# Patient Record
Sex: Female | Born: 2000 | Race: White | Hispanic: No | Marital: Single | Smoking: Never smoker
Health system: Southern US, Community
[De-identification: ages and names within clinical notes are randomized; demographics above are authoritative.]

## PROBLEM LIST (undated history)

## (undated) DIAGNOSIS — I491 Atrial premature depolarization: Secondary | ICD-10-CM

## (undated) DIAGNOSIS — F32A Depression, unspecified: Secondary | ICD-10-CM

## (undated) DIAGNOSIS — Z789 Other specified health status: Secondary | ICD-10-CM

## (undated) DIAGNOSIS — J039 Acute tonsillitis, unspecified: Secondary | ICD-10-CM

## (undated) DIAGNOSIS — D649 Anemia, unspecified: Secondary | ICD-10-CM

## (undated) HISTORY — PX: NO PAST SURGERIES: SHX2092

---
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
## 2020-02-13 ENCOUNTER — Other Ambulatory Visit: Payer: Self-pay

## 2020-02-13 ENCOUNTER — Ambulatory Visit (HOSPITAL_COMMUNITY)
Admission: EM | Admit: 2020-02-13 | Discharge: 2020-02-13 | Disposition: A | Discharge status: Home / Self Care | Payer: Medicaid Other | Attending: Family Medicine | Admitting: Family Medicine

## 2020-02-13 ENCOUNTER — Encounter (HOSPITAL_COMMUNITY): Payer: Self-pay

## 2020-02-13 DIAGNOSIS — J069 Acute upper respiratory infection, unspecified: Secondary | ICD-10-CM

## 2020-02-13 DIAGNOSIS — J029 Acute pharyngitis, unspecified: Secondary | ICD-10-CM | POA: Diagnosis present

## 2020-02-13 DIAGNOSIS — Z20822 Contact with and (suspected) exposure to covid-19: Secondary | ICD-10-CM | POA: Diagnosis not present

## 2020-02-13 LAB — POCT RAPID STREP A, ED / UC: Streptococcus, Group A Screen (Direct): NEGATIVE

## 2020-02-13 LAB — POC URINE PREG, ED: Preg Test, Ur: NEGATIVE

## 2020-02-13 LAB — SARS CORONAVIRUS 2 (TAT 6-24 HRS): SARS Coronavirus 2: NEGATIVE

## 2020-02-13 MED ORDER — DEXAMETHASONE SODIUM PHOSPHATE 10 MG/ML IJ SOLN
10.0000 mg | Freq: Once | INTRAMUSCULAR | Status: AC
  Administered 2020-02-13: 10 mg via INTRAMUSCULAR

## 2020-02-13 MED ORDER — LIDOCAINE VISCOUS HCL 2 % MT SOLN: 20.0000 mL | OROMUCOSAL | 0 refills | Status: DC | PRN

## 2020-02-13 MED ORDER — DEXAMETHASONE SODIUM PHOSPHATE 10 MG/ML IJ SOLN
INTRAMUSCULAR | Status: AC
  Filled 2020-02-13: qty 1

## 2020-02-13 NOTE — ED Triage Notes (Signed)
Pt is here with chest congestion and sore throat that started 3 days ago, pt has taken an old Amoxillin prescription(05/2018) to relieve discomfort.

## 2020-02-13 NOTE — ED Provider Notes (Signed)
[facility]    CSN: [id number] Arrival date & time: 02/13/20  2202      History   Chief Complaint Chief Complaint  Patient presents with  . Sore Throat  . CHEST CONGESTION    HPI Debbie Miller is a 20 y.o. female.   Here today with 3 day history of congestion, productive cough, sore throat with swollen tonsils, N/V. Denies CP, SOB, HAs, diarrhea, rashes. Had some old amoxil that she's been taking without much relief. Several sick contacts recently. States she has a frequent hx of tonsillitis and scheduled soon to have tonsils removed.      Past Medical History:  Diagnosis Date  . Anemia   . Medical history non-contributory   . PAC (premature atrial contraction)     There are no problems to display for this patient.   Past Surgical History:  Procedure Laterality Date  . NO PAST SURGERIES      OB History    Gravida  0   Para  0   Term  0   Preterm  0   AB  0   Living  0     SAB  0   IAB  0   Ectopic  0   Multiple  0   Live Births  0            Home Medications    Prior to Admission medications   Medication Sig Start Date End Date Taking? Authorizing Provider  lidocaine (XYLOCAINE) 2 % solution Use as directed 20 mLs in the mouth or throat as needed for mouth pain. 02/13/20  Yes Volney American, PA-C  ibuprofen (ADVIL) 600 MG tablet Take 1 tablet (600 mg total) by mouth every 6 (six) hours as needed. 04/19/19   Fawze, Mina A, PA-C  megestrol (MEGACE) 40 MG tablet Take 1 tablet (40 mg total) by mouth 2 (two) times daily. 12/04/19   Nugent, Gerrie Nordmann, NP  ondansetron (ZOFRAN ODT) 4 MG disintegrating tablet Take 1 tablet (4 mg total) by mouth every 8 (eight) hours as needed for nausea or vomiting. 04/19/19   Nils Flack, Mina A, PA-C  phenol (CHLORASEPTIC) 1.4 % LIQD Use as directed 1 spray in the mouth or throat as needed for throat irritation / pain. 04/19/19   Fawze, Mina A, PA-C  diphenhydrAMINE (BENADRYL) 25 MG tablet Take 50 mg by  mouth every 6 (six) hours as needed (for reactions to environmental allergies).   01/21/19  [provider]    Family History Family History  Problem Relation Age of Onset  . Healthy Mother   . Hypertension Maternal Grandmother   . Hypertension Maternal Grandfather   . COPD Maternal Grandfather   . Heart Problems Father     Social History Social History   Tobacco Use  . Smoking status: Former Smoker    Types: Cigarettes  . Smokeless tobacco: Never Used  Vaping Use  . Vaping Use: Some days  Substance Use Topics  . Alcohol use: Never  . Drug use: No     Allergies   Patient has no known allergies.   Review of Systems Review of Systems PER HPI   Physical Exam Triage Vital Signs ED Triage Vitals  Enc Vitals Group     BP 02/13/20 0901 108/72     Pulse Rate 02/13/20 0901 (!) 102     Resp 02/13/20 0901 17     Temp 02/13/20 0901 98.7 F (37.1 C)     Temp Source  02/13/20 0901 Oral     SpO2 02/13/20 0901 100 %     Weight --      Height --      Head Circumference --      Peak Flow --      Pain Score 02/13/20 0858 0     Pain Loc --      Pain Edu? --      Excl. in GC? --    No data found.  Updated Vital Signs BP 108/72 (BP Location: Right Arm)   Pulse (!) 102   Temp 98.7 F (37.1 C) (Oral)   Resp 17   LMP 12/22/2019   SpO2 100%   Visual Acuity Right Eye Distance:   Left Eye Distance:   Bilateral Distance:    Right Eye Near:   Left Eye Near:    Bilateral Near:     Physical Exam Vitals and nursing note reviewed.  Constitutional:      Appearance: Normal appearance. She is not ill-appearing.  HENT:     Head: Atraumatic.     Right Ear: Tympanic membrane normal.     Left Ear: Tympanic membrane normal.     Nose: Nose normal.     Mouth/Throat:     Mouth: Mucous membranes are moist.     Pharynx: Oropharyngeal exudate and posterior oropharyngeal erythema (b/l tonsillar erythema, edema and exudates) present.     Comments: Uvula midline, airway  patent Eyes:     Extraocular Movements: Extraocular movements intact.     Conjunctiva/sclera: Conjunctivae normal.  Cardiovascular:     Rate and Rhythm: Normal rate and regular rhythm.     Heart sounds: Normal heart sounds.  Pulmonary:     Effort: Pulmonary effort is normal. No respiratory distress.     Breath sounds: Normal breath sounds. No wheezing or rales.  Abdominal:     General: Bowel sounds are normal. There is no distension.     Palpations: Abdomen is soft.     Tenderness: There is no abdominal tenderness. There is no guarding.  Musculoskeletal:        General: Normal range of motion.     Cervical back: Normal range of motion and neck supple.  Skin:    General: Skin is warm and dry.  Neurological:     Mental Status: She is alert and oriented to person, place, and time.  Psychiatric:        Mood and Affect: Mood normal.        Thought Content: Thought content normal.        Judgment: Judgment normal.      UC Treatments / Results  Labs (all labs ordered are listed, but only abnormal results are displayed) Labs Reviewed  CULTURE, GROUP A STREP ([facility])  SARS CORONAVIRUS 2 (TAT 6-24 HRS)  POCT RAPID STREP A, ED / UC  POC URINE PREG, ED    EKG   Radiology No results found.  Procedures Procedures (including critical care time)  Medications Ordered in UC Medications  dexamethasone (DECADRON) injection 10 mg (10 mg Intramuscular Given 02/13/20 1040)    Initial Impression / Assessment and Plan / UC Course  I have reviewed the triage vital signs and the nursing notes.  Pertinent labs & imaging results that were available during my care of the patient were reviewed by me and considered in my medical decision making (see chart for details).     Overall well appearing and in no distress. Rapid strep neg, COVID and throat  culture pending. IM decadron given for tonsillar edema, viscous lidocaine for comfort, OTC pain relievers. Work note and isolation reviewed.  Return for worsening sxs.   Final Clinical Impressions(s) / UC Diagnoses   Final diagnoses:  Viral URI with cough  Pharyngitis, unspecified etiology   Discharge Instructions   None    ED Prescriptions    Medication Sig Dispense Auth. Provider   lidocaine (XYLOCAINE) 2 % solution Use as directed 20 mLs in the mouth or throat as needed for mouth pain. 100 mL Particia Nearing, [place]     PDMP not reviewed this encounter.   Particia Nearing, [place] 02/13/20 [phone]

## 2020-02-15 LAB — CULTURE, GROUP A STREP (THRC)

## 2020-02-27 ENCOUNTER — Other Ambulatory Visit: Payer: Self-pay | Admitting: Otolaryngology

## 2020-03-02 ENCOUNTER — Other Ambulatory Visit: Payer: Self-pay

## 2020-03-02 ENCOUNTER — Encounter (HOSPITAL_BASED_OUTPATIENT_CLINIC_OR_DEPARTMENT_OTHER): Payer: Self-pay | Admitting: Otolaryngology

## 2020-03-03 ENCOUNTER — Other Ambulatory Visit (HOSPITAL_COMMUNITY)
Admission: RE | Admit: 2020-03-03 | Discharge: 2020-03-03 | Disposition: A | Discharge status: Home / Self Care | Payer: Medicaid Other | Source: Ambulatory Visit | Attending: Otolaryngology | Admitting: Otolaryngology

## 2020-03-03 DIAGNOSIS — U071 COVID-19: Secondary | ICD-10-CM | POA: Diagnosis not present

## 2020-03-03 DIAGNOSIS — Z01812 Encounter for preprocedural laboratory examination: Secondary | ICD-10-CM | POA: Diagnosis not present

## 2020-03-03 LAB — SARS CORONAVIRUS 2 (TAT 6-24 HRS): SARS Coronavirus 2: POSITIVE — AB

## 2020-03-04 ENCOUNTER — Telehealth: Payer: Self-pay | Admitting: *Deleted

## 2020-03-04 NOTE — Telephone Encounter (Signed)
Called to discuss with patient about COVID-19 symptoms and the use of one of the available treatments for those with mild to moderate Covid symptoms and at a high risk of hospitalization.  Pt appears to qualify for outpatient treatment due to co-morbid conditions and/or a member of an at-risk group in accordance with the FDA Emergency Use Authorization.   Patient reported this was a screen for upcoming procedure. She is asymptomatic now but had several symptoms last week.  Due to lack of symptoms today she does not qualify for Covid treatment.   Reviewed the following with the patient: Your Covid 19 results are positive. This means you do have the virus and can spread these germs to others at this time.  Discussed while staying home and away from others for 5 days wash your hands frequently and wear a mask around others if you need to come out of your area. Drink plenty of fluids to stay hydrated. Ensure disinfecting of all common household areas often.With any breathing concerns call your doctor or seek treatment at the ED with a mask on. At the end of your 5 days of isolation you must be fever free then wear a mask around others for the next 5 days.     Debbie Miller

## 2020-03-04 NOTE — Progress Notes (Signed)
LVM with Dr. Thurmon Fair surgery scheduler Caryn Bee to notify them of patients + COVID result and that patients surgery for Friday 03/06/20 will need to be cancelled and rescheduled in 10 days pending patient does not have symptoms . Pt is aware of her test results.

## 2020-03-06 ENCOUNTER — Ambulatory Visit (HOSPITAL_BASED_OUTPATIENT_CLINIC_OR_DEPARTMENT_OTHER): Admission: RE | Admit: 2020-03-06 | Payer: Medicaid Other | Source: Home / Self Care | Admitting: Otolaryngology

## 2020-03-06 HISTORY — DX: Acute tonsillitis, unspecified: J03.90

## 2020-03-06 SURGERY — TONSILLECTOMY AND ADENOIDECTOMY
Anesthesia: General | Laterality: Bilateral

## 2020-03-25 ENCOUNTER — Encounter: Payer: Medicaid Other | Admitting: Family Medicine

## 2020-03-31 ENCOUNTER — Encounter (HOSPITAL_BASED_OUTPATIENT_CLINIC_OR_DEPARTMENT_OTHER): Payer: Self-pay | Admitting: Otolaryngology

## 2020-03-31 ENCOUNTER — Other Ambulatory Visit: Payer: Self-pay

## 2020-04-08 ENCOUNTER — Encounter (HOSPITAL_BASED_OUTPATIENT_CLINIC_OR_DEPARTMENT_OTHER)
Admission: RE | Disposition: A | Discharge status: Home / Self Care | Payer: Self-pay | Source: Home / Self Care | Attending: Otolaryngology

## 2020-04-08 ENCOUNTER — Ambulatory Visit (HOSPITAL_BASED_OUTPATIENT_CLINIC_OR_DEPARTMENT_OTHER)
Admission: RE | Admit: 2020-04-08 | Discharge: 2020-04-08 | Disposition: A | Discharge status: Home / Self Care | Payer: Medicaid Other | Attending: Otolaryngology | Admitting: Otolaryngology

## 2020-04-08 ENCOUNTER — Encounter (HOSPITAL_BASED_OUTPATIENT_CLINIC_OR_DEPARTMENT_OTHER): Payer: Self-pay | Admitting: Otolaryngology

## 2020-04-08 ENCOUNTER — Ambulatory Visit (HOSPITAL_BASED_OUTPATIENT_CLINIC_OR_DEPARTMENT_OTHER): Payer: Medicaid Other | Admitting: Certified Registered"

## 2020-04-08 ENCOUNTER — Other Ambulatory Visit: Payer: Self-pay

## 2020-04-08 DIAGNOSIS — J0301 Acute recurrent streptococcal tonsillitis: Secondary | ICD-10-CM | POA: Insufficient documentation

## 2020-04-08 DIAGNOSIS — J353 Hypertrophy of tonsils with hypertrophy of adenoids: Secondary | ICD-10-CM | POA: Diagnosis present

## 2020-04-08 DIAGNOSIS — Z87891 Personal history of nicotine dependence: Secondary | ICD-10-CM | POA: Insufficient documentation

## 2020-04-08 DIAGNOSIS — J039 Acute tonsillitis, unspecified: Secondary | ICD-10-CM

## 2020-04-08 DIAGNOSIS — J351 Hypertrophy of tonsils: Secondary | ICD-10-CM | POA: Insufficient documentation

## 2020-04-08 HISTORY — DX: Depression, unspecified: F32.A

## 2020-04-08 HISTORY — PX: TONSILLECTOMY AND ADENOIDECTOMY: SHX28

## 2020-04-08 LAB — POCT PREGNANCY, URINE: Preg Test, Ur: NEGATIVE

## 2020-04-08 SURGERY — TONSILLECTOMY AND ADENOIDECTOMY
Anesthesia: General | Site: Throat | Laterality: Bilateral

## 2020-04-08 MED ORDER — HYDROCODONE-ACETAMINOPHEN 7.5-325 MG/15ML PO SOLN
10.0000 mL | Freq: Four times a day (QID) | ORAL | Status: DC | PRN
  Administered 2020-04-08: 15 mL via ORAL
  Filled 2020-04-08: qty 15

## 2020-04-08 MED ORDER — SUGAMMADEX SODIUM 200 MG/2ML IV SOLN: INTRAVENOUS | Status: DC | PRN

## 2020-04-08 MED ORDER — LIDOCAINE 2% (20 MG/ML) 5 ML SYRINGE
INTRAMUSCULAR | Status: AC
  Filled 2020-04-08: qty 5

## 2020-04-08 MED ORDER — HYDROCODONE-ACETAMINOPHEN 7.5-325 MG/15ML PO SOLN: ORAL | 0 refills | Status: DC

## 2020-04-08 MED ORDER — FENTANYL CITRATE (PF) 100 MCG/2ML IJ SOLN
25.0000 ug | INTRAMUSCULAR | Status: DC | PRN
  Administered 2020-04-08: 50 ug via INTRAVENOUS
  Administered 2020-04-08: 25 ug via INTRAVENOUS

## 2020-04-08 MED ORDER — DEXAMETHASONE SODIUM PHOSPHATE 10 MG/ML IJ SOLN
10.0000 mg | Freq: Once | INTRAMUSCULAR | Status: AC
  Administered 2020-04-08: 10 mg via INTRAVENOUS
  Filled 2020-04-08: qty 1

## 2020-04-08 MED ORDER — FENTANYL CITRATE (PF) 100 MCG/2ML IJ SOLN
INTRAMUSCULAR | Status: DC | PRN
  Administered 2020-04-08 (×2): 50 ug via INTRAVENOUS
  Administered 2020-04-08: 100 ug via INTRAVENOUS

## 2020-04-08 MED ORDER — SCOPOLAMINE 1 MG/3DAYS TD PT72
1.0000 | MEDICATED_PATCH | TRANSDERMAL | Status: DC
  Administered 2020-04-08: 1.5 mg via TRANSDERMAL

## 2020-04-08 MED ORDER — ONDANSETRON HCL 4 MG/2ML IJ SOLN
INTRAMUSCULAR | Status: DC | PRN
  Administered 2020-04-08: 4 mg via INTRAVENOUS

## 2020-04-08 MED ORDER — SUGAMMADEX SODIUM 200 MG/2ML IV SOLN
INTRAVENOUS | Status: DC | PRN
  Administered 2020-04-08: 200 mg via INTRAVENOUS

## 2020-04-08 MED ORDER — CEFAZOLIN SODIUM-DEXTROSE 2-4 GM/100ML-% IV SOLN
2.0000 g | INTRAVENOUS | Status: AC
  Administered 2020-04-08: 2 g via INTRAVENOUS

## 2020-04-08 MED ORDER — ACETAMINOPHEN 500 MG PO TABS
ORAL_TABLET | ORAL | Status: AC
  Filled 2020-04-08: qty 2

## 2020-04-08 MED ORDER — ONDANSETRON HCL 4 MG/2ML IJ SOLN
INTRAMUSCULAR | Status: AC
  Filled 2020-04-08: qty 2

## 2020-04-08 MED ORDER — ACETAMINOPHEN 500 MG PO TABS
1000.0000 mg | ORAL_TABLET | Freq: Once | ORAL | Status: AC
  Administered 2020-04-08: 1000 mg via ORAL

## 2020-04-08 MED ORDER — PROPOFOL 10 MG/ML IV BOLUS
INTRAVENOUS | Status: DC | PRN
  Administered 2020-04-08: 100 ug/kg/min via INTRAVENOUS
  Administered 2020-04-08: 50 mg via INTRAVENOUS
  Administered 2020-04-08: 150 mg via INTRAVENOUS

## 2020-04-08 MED ORDER — CEFAZOLIN SODIUM-DEXTROSE 2-4 GM/100ML-% IV SOLN
INTRAVENOUS | Status: AC
  Filled 2020-04-08: qty 100

## 2020-04-08 MED ORDER — PHENOL 1.4 % MT LIQD: 1.0000 | OROMUCOSAL | Status: DC | PRN

## 2020-04-08 MED ORDER — ROCURONIUM BROMIDE 10 MG/ML (PF) SYRINGE
PREFILLED_SYRINGE | INTRAVENOUS | Status: AC
  Filled 2020-04-08: qty 10

## 2020-04-08 MED ORDER — DEXMEDETOMIDINE (PRECEDEX) IN NS 20 MCG/5ML (4 MCG/ML) IV SYRINGE
PREFILLED_SYRINGE | INTRAVENOUS | Status: DC | PRN
  Administered 2020-04-08: 8 ug via INTRAVENOUS
  Administered 2020-04-08: 4 ug via INTRAVENOUS

## 2020-04-08 MED ORDER — FENTANYL CITRATE (PF) 100 MCG/2ML IJ SOLN
INTRAMUSCULAR | Status: AC
  Filled 2020-04-08: qty 2

## 2020-04-08 MED ORDER — AMOXICILLIN-POT CLAVULANATE 250-62.5 MG/5ML PO SUSR: 10.0000 mL | Freq: Two times a day (BID) | ORAL | 0 refills | Status: AC

## 2020-04-08 MED ORDER — DEXAMETHASONE SODIUM PHOSPHATE 4 MG/ML IJ SOLN
INTRAMUSCULAR | Status: DC | PRN
  Administered 2020-04-08: 10 mg via INTRAVENOUS

## 2020-04-08 MED ORDER — PROPOFOL 500 MG/50ML IV EMUL
INTRAVENOUS | Status: AC
  Filled 2020-04-08: qty 50

## 2020-04-08 MED ORDER — MORPHINE SULFATE (PF) 4 MG/ML IV SOLN: 1.0000 mg | INTRAVENOUS | Status: DC | PRN

## 2020-04-08 MED ORDER — AMISULPRIDE (ANTIEMETIC) 5 MG/2ML IV SOLN: 10.0000 mg | Freq: Once | INTRAVENOUS | Status: DC | PRN

## 2020-04-08 MED ORDER — SCOPOLAMINE 1 MG/3DAYS TD PT72
MEDICATED_PATCH | TRANSDERMAL | Status: AC
  Filled 2020-04-08: qty 1

## 2020-04-08 MED ORDER — ROCURONIUM BROMIDE 100 MG/10ML IV SOLN
INTRAVENOUS | Status: DC | PRN
  Administered 2020-04-08: 50 mg via INTRAVENOUS

## 2020-04-08 MED ORDER — MIDAZOLAM HCL 5 MG/5ML IJ SOLN
INTRAMUSCULAR | Status: DC | PRN
  Administered 2020-04-08: 2 mg via INTRAVENOUS

## 2020-04-08 MED ORDER — MIDAZOLAM HCL 2 MG/2ML IJ SOLN
INTRAMUSCULAR | Status: AC
  Filled 2020-04-08: qty 2

## 2020-04-08 MED ORDER — ONDANSETRON HCL 4 MG/2ML IJ SOLN: 4.0000 mg | INTRAMUSCULAR | Status: DC | PRN

## 2020-04-08 MED ORDER — LACTATED RINGERS IV SOLN: INTRAVENOUS | Status: DC

## 2020-04-08 MED ORDER — OXYCODONE HCL 5 MG PO TABS: 5.0000 mg | ORAL_TABLET | Freq: Once | ORAL | Status: DC | PRN

## 2020-04-08 MED ORDER — DEXAMETHASONE SODIUM PHOSPHATE 10 MG/ML IJ SOLN
INTRAMUSCULAR | Status: AC
  Filled 2020-04-08: qty 1

## 2020-04-08 MED ORDER — LIDOCAINE HCL (CARDIAC) PF 100 MG/5ML IV SOSY
PREFILLED_SYRINGE | INTRAVENOUS | Status: DC | PRN
  Administered 2020-04-08: 60 mg via INTRAVENOUS

## 2020-04-08 MED ORDER — ONDANSETRON HCL 4 MG PO TABS: 4.0000 mg | ORAL_TABLET | ORAL | Status: DC | PRN

## 2020-04-08 MED ORDER — PROPOFOL 10 MG/ML IV BOLUS
INTRAVENOUS | Status: AC
  Filled 2020-04-08: qty 40

## 2020-04-08 MED ORDER — IBUPROFEN 100 MG/5ML PO SUSP: 600.0000 mg | Freq: Four times a day (QID) | ORAL | Status: DC | PRN

## 2020-04-08 MED ORDER — DEXTROSE IN LACTATED RINGERS 5 % IV SOLN: INTRAVENOUS | Status: DC

## 2020-04-08 MED ORDER — OXYCODONE HCL 5 MG/5ML PO SOLN: 5.0000 mg | Freq: Once | ORAL | Status: DC | PRN

## 2020-04-08 SURGICAL SUPPLY — 29 items
CANISTER SUCT 1200ML W/VALVE (MISCELLANEOUS) ×2 IMPLANT
CATH ROBINSON RED A/P 10FR (CATHETERS) ×2 IMPLANT
COAGULATOR SUCT SWTCH 10FR 6 (ELECTROSURGICAL) ×2 IMPLANT
COVER BACK TABLE 60X90IN (DRAPES) ×2 IMPLANT
COVER MAYO STAND STRL (DRAPES) ×2 IMPLANT
COVER WAND RF STERILE (DRAPES) IMPLANT
ELECT COATED BLADE 2.86 ST (ELECTRODE) ×2 IMPLANT
ELECT REM PT RETURN 9FT ADLT (ELECTROSURGICAL) ×2
ELECT REM PT RETURN 9FT PED (ELECTROSURGICAL)
ELECTRODE REM PT RETRN 9FT PED (ELECTROSURGICAL) IMPLANT
ELECTRODE REM PT RTRN 9FT ADLT (ELECTROSURGICAL) ×1 IMPLANT
GAUZE SPONGE 4X4 12PLY STRL LF (GAUZE/BANDAGES/DRESSINGS) ×2 IMPLANT
GLOVE SURG ENC TEXT LTX SZ7 (GLOVE) ×2 IMPLANT
GOWN STRL REUS W/ TWL LRG LVL3 (GOWN DISPOSABLE) ×2 IMPLANT
GOWN STRL REUS W/TWL LRG LVL3 (GOWN DISPOSABLE) ×4
MARKER SKIN DUAL TIP RULER LAB (MISCELLANEOUS) IMPLANT
NS IRRIG 1000ML POUR BTL (IV SOLUTION) ×2 IMPLANT
PENCIL SMOKE EVACUATOR (MISCELLANEOUS) ×2 IMPLANT
PIN SAFETY STERILE (MISCELLANEOUS) IMPLANT
SHEET MEDIUM DRAPE 40X70 STRL (DRAPES) ×2 IMPLANT
SOLUTION BUTLER CLEAR DIP (MISCELLANEOUS) IMPLANT
SPONGE TONSIL TAPE 1 RFD (DISPOSABLE) IMPLANT
SPONGE TONSIL TAPE 1.25 RFD (DISPOSABLE) ×2 IMPLANT
SYR BULB EAR ULCER 3OZ GRN STR (SYRINGE) ×2 IMPLANT
TOWEL GREEN STERILE FF (TOWEL DISPOSABLE) ×2 IMPLANT
TUBE CONNECTING 20X1/4 (TUBING) ×2 IMPLANT
TUBE SALEM SUMP 12R W/ARV (TUBING) IMPLANT
TUBE SALEM SUMP 16 FR W/ARV (TUBING) ×2 IMPLANT
YANKAUER SUCT BULB TIP NO VENT (SUCTIONS) ×2 IMPLANT

## 2020-04-08 NOTE — Anesthesia Procedure Notes (Signed)
Procedure Name: Intubation Performed by: Ezequiel Kayser, CRNA Pre-anesthesia Checklist: Patient identified, Emergency Drugs available, Suction available and Patient being monitored Patient Re-evaluated:Patient Re-evaluated prior to induction Oxygen Delivery Method: Circle System Utilized Preoxygenation: Pre-oxygenation with 100% oxygen Induction Type: IV induction Ventilation: Mask ventilation without difficulty Laryngoscope Size: Mac and 3 Grade View: Grade I Tube type: Oral Rae Tube size: 7.0 mm Number of attempts: 1 Airway Equipment and Method: Stylet and Oral airway Placement Confirmation: ETT inserted through vocal cords under direct vision,  positive ETCO2 and breath sounds checked- equal and bilateral Tube secured with: Tape Dental Injury: Teeth and Oropharynx as per pre-operative assessment  Comments: Eyes taped prior to mask ventilation

## 2020-04-08 NOTE — Discharge Instructions (Signed)

## 2020-04-08 NOTE — Anesthesia Postprocedure Evaluation (Signed)
Anesthesia Post Note  Patient: Debbie Miller  Procedure(s) Performed: TONSILLECTOMY AND ADENOIDECTOMY (Bilateral Throat)     Patient location during evaluation: PACU Anesthesia Type: General Level of consciousness: awake and alert Pain management: pain level controlled Vital Signs Assessment: post-procedure vital signs reviewed and stable Respiratory status: spontaneous breathing, nonlabored ventilation, respiratory function stable and patient connected to nasal cannula oxygen Cardiovascular status: blood pressure returned to baseline and stable Postop Assessment: no apparent nausea or vomiting Anesthetic complications: no   No complications documented.  Last Vitals:  Vitals:   04/08/20 1015 04/08/20 1033  BP: 106/63 106/66  Pulse: 62 (!) 54  Resp: 14 16  Temp:  36.4 C  SpO2: 100% 100%    Last Pain:  Vitals:   04/08/20 1033  TempSrc:   PainSc: 6                  Kennieth Rad

## 2020-04-08 NOTE — Op Note (Signed)
Operative Note: Tonsillectomy and Adenoidectomy  Patient: Debbie Miller  Medical record number: [id number]  Date:04/08/2020  Pre-operative Indications: Recurrent streptococcal tonsillitis  Postoperative Indications: Same  Surgical Procedure: Tonsillectomy and Adenoidectomy  Anesthesia: GET  Surgeon: Barbee Cough, M.D.  Complications: None  EBL: Minimal   Brief History: The patient is a 20 y.o. female with a history of recurrent acute tonsillitis and adenotonsillar hypertrophy. The patient has been on multiple courses of antibiotics for recurrent infection and has a history of nighttime snoring and intermittent airway obstruction. Patient's history and findings I recommended tonsillectomy and adenoidectomy under general anesthesia, risks and benefits were discussed in detail with the patient and family. They understand and agree with our plan for surgery which is scheduled on elective basis at [facility].  Surgical Procedure: The patient is brought to the operating room on 04/08/2020 and placed in supine position on the operating table. General endotracheal anesthesia was established without difficulty. When the patient was adequately anesthetized, surgical timeout was performed and correct identification of the patient and the surgical procedure. The patient was positioned and prepped and draped in sterile fashion.  With the patient prepared for surgery a Lisabeth Register mouth gag was inserted without difficulty, there were no loose or broken teeth and the hard soft palate were intact. Procedure was begun with adenoidectomy, using Bovie suction cautery at 45 W the adenoid tissue was completely ablated in the nasopharynx, no bleeding or evidence of residual adenoidal tissue. Tonsillectomy was then performed, using Bovie cautery and dissecting in a subcapsular fashion the entire left tonsil was removed from superior pole to tongue base. Right tonsil removed in a similar fashion. The tonsillar fossa  were gently abraded with dry tonsil sponge and several small areas of point hemorrhage were cauterized with suction cautery. The Crowe-Davis mouth gag was released and reapplied there is no active bleeding. Oral cavity and nasopharynx were irrigated with saline. An orogastric tube was passed and stomach contents were aspirated. Mouthgag was removed, again no loose or broken teeth and no bleeding.   Patient was awakened from anesthetic and extubated, then transferred from the operating room to the recovery room in stable condition. There were no complications and blood loss was minimal.   Barbee Cough, M.D. [facility] 04/08/2020

## 2020-04-08 NOTE — Transfer of Care (Signed)
Immediate Anesthesia Transfer of Care Note  Patient: Debbie Miller  Procedure(s) Performed: TONSILLECTOMY AND ADENOIDECTOMY (Bilateral Throat)  Patient Location: PACU  Anesthesia Type:General  Level of Consciousness: awake, alert , oriented and patient cooperative  Airway & Oxygen Therapy: Patient Spontanous Breathing and Patient connected to face mask oxygen  Post-op Assessment: Report given to RN and Post -op Vital signs reviewed and stable  Post vital signs: Reviewed and stable  Last Vitals:  Vitals Value Taken Time  BP    Temp    Pulse 55 04/08/20 0941  Resp    SpO2 100 % 04/08/20 0941  Vitals shown include unvalidated device data.  Last Pain:  Vitals:   04/08/20 0654  TempSrc: Oral  PainSc: 0-No pain      Patients Stated Pain Goal: 2 (04/08/20 0654)  Complications: No complications documented.

## 2020-04-08 NOTE — H&P (Signed)
Debbie Miller is an 20 y.o. female.   Chief Complaint: Recurrent tonsillitis HPI: Hx of recurrent tonsillitis  Past Medical History:  Diagnosis Date  . Anemia   . Depression   . PAC (premature atrial contraction)   . Tonsillitis     Past Surgical History:  Procedure Laterality Date  . NO PAST SURGERIES      Family History  Problem Relation Age of Onset  . Healthy Mother   . Hypertension Maternal Grandmother   . Hypertension Maternal Grandfather   . COPD Maternal Grandfather   . Heart Problems Father    Social History:  reports that she has quit smoking. Her smoking use included cigarettes. She has never used smokeless tobacco. She reports that she does not drink alcohol and does not use drugs.  Allergies: No Known Allergies  No medications prior to admission.    Results for orders placed or performed during the hospital encounter of 04/08/20 (from the past 48 hour(s))  Pregnancy, urine POC     Status: None   Collection Time: 04/08/20  6:41 AM  Result Value Ref Range   Preg Test, Ur NEGATIVE NEGATIVE    Comment:        THE SENSITIVITY OF THIS METHODOLOGY IS >24 mIU/mL    No results found.  Review of Systems  HENT: Positive for sore throat.   Respiratory: Negative.   Cardiovascular: Negative.     Blood pressure (!) 106/53, pulse 63, temperature 98.2 F (36.8 C), temperature source Oral, resp. rate 18, height 5\' 9"  (1.753 m), weight 63 kg, last menstrual period 03/29/2020, SpO2 100 %. Physical Exam Constitutional:      Appearance: Normal appearance.  HENT:     Mouth/Throat:     Comments: Tonsillar hypertrophy Cardiovascular:     Rate and Rhythm: Normal rate.  Pulmonary:     Effort: Pulmonary effort is normal.  Musculoskeletal:     Cervical back: Normal range of motion.  Neurological:     Mental Status: She is alert.      Assessment/Plan Adm for OP T&A  03/31/2020, MD 04/08/2020, 8:32 AM

## 2020-04-08 NOTE — Anesthesia Preprocedure Evaluation (Signed)
Anesthesia Evaluation  Patient identified by MRN, date of birth, ID band Patient awake    Reviewed: Allergy & Precautions, NPO status , Patient's Chart, lab work & pertinent test results  Airway Mallampati: II  TM Distance: >3 FB Neck ROM: Full    Dental  (+) Dental Advisory Given   Pulmonary former smoker,    breath sounds clear to auscultation       Cardiovascular negative cardio ROS   Rhythm:Regular Rate:Normal     Neuro/Psych negative neurological ROS     GI/Hepatic negative GI ROS, Neg liver ROS,   Endo/Other    Renal/GU negative Renal ROS     Musculoskeletal   Abdominal   Peds  Hematology negative hematology ROS (+)   Anesthesia Other Findings   Reproductive/Obstetrics                             Anesthesia Physical Anesthesia Plan  ASA: I  Anesthesia Plan: General   Post-op Pain Management:    Induction: Intravenous  PONV Risk Score and Plan: 4 or greater and Ondansetron, Dexamethasone, Midazolam and Scopolamine patch - Pre-op  Airway Management Planned: Oral ETT  Additional Equipment: None  Intra-op Plan:   Post-operative Plan: Extubation in OR  Informed Consent: I have reviewed the patients History and Physical, chart, labs and discussed the procedure including the risks, benefits and alternatives for the proposed anesthesia with the patient or authorized representative who has indicated his/her understanding and acceptance.     Dental advisory given  Plan Discussed with: CRNA  Anesthesia Plan Comments:         Anesthesia Quick Evaluation

## 2020-04-09 ENCOUNTER — Encounter (HOSPITAL_BASED_OUTPATIENT_CLINIC_OR_DEPARTMENT_OTHER): Payer: Self-pay | Admitting: Otolaryngology

## 2020-04-09 LAB — SURGICAL PATHOLOGY

## 2020-07-04 ENCOUNTER — Ambulatory Visit
Admission: EM | Admit: 2020-07-04 | Discharge: 2020-07-04 | Disposition: A | Discharge status: Home / Self Care | Payer: Medicaid Other | Attending: Emergency Medicine | Admitting: Emergency Medicine

## 2020-07-04 ENCOUNTER — Encounter: Payer: Self-pay | Admitting: Emergency Medicine

## 2020-07-04 ENCOUNTER — Other Ambulatory Visit: Payer: Self-pay

## 2020-07-04 DIAGNOSIS — R110 Nausea: Secondary | ICD-10-CM | POA: Diagnosis not present

## 2020-07-04 DIAGNOSIS — J029 Acute pharyngitis, unspecified: Secondary | ICD-10-CM | POA: Diagnosis not present

## 2020-07-04 LAB — POCT RAPID STREP A (OFFICE): Rapid Strep A Screen: NEGATIVE

## 2020-07-04 MED ORDER — CETIRIZINE HCL 10 MG PO CAPS: 10.0000 mg | ORAL_CAPSULE | Freq: Every day | ORAL | 0 refills | Status: DC

## 2020-07-04 MED ORDER — ONDANSETRON 4 MG PO TBDP: 4.0000 mg | ORAL_TABLET | Freq: Three times a day (TID) | ORAL | 0 refills | Status: DC | PRN

## 2020-07-04 MED ORDER — IBUPROFEN 800 MG PO TABS: 800.0000 mg | ORAL_TABLET | Freq: Three times a day (TID) | ORAL | 0 refills | Status: DC

## 2020-07-04 NOTE — Discharge Instructions (Addendum)
Sore Throat  Your rapid strep tested Negative today. Symptoms likely viral and should improve over the next 3-4 days  Please continue Tylenol or Ibuprofen for fever and pain. May try salt water gargles, cepacol lozenges, throat spray, or OTC cold relief medicine for throat discomfort. If you also have congestion take a daily anti-histamine like Zyrtec, Claritin, and a oral decongestant to help with post nasal drip that may be irritating your throat.   Stay hydrated and drink plenty of fluids to keep your throat coated relieve irritation.

## 2020-07-04 NOTE — ED Triage Notes (Signed)
Pt sts sore throat and nausea x 3 days; pt sts HA; pt sts had tonsils removed 1 month ago

## 2020-07-04 NOTE — ED Provider Notes (Signed)
[facility]    CSN: [id number] Arrival date & time: 07/04/20  1244      History   Chief Complaint Chief Complaint  Patient presents with  . Sore Throat  . Nausea    HPI Debbie Miller is a 20 y.o. female presenting today for evaluation of sore throat.  Reports 3 days of sore throat with associated nausea.  Intermittent dizziness/lightheadedness and headache.  Reports had tonsillectomy approximately 1 month ago.  Has had associated congestion and drainage.  Denies fevers.  Denies cough.  HPI  Past Medical History:  Diagnosis Date  . Anemia   . Depression   . PAC (premature atrial contraction)   . Tonsillitis     Patient Active Problem List   Diagnosis Date Noted  . Acute tonsillitis 04/08/2020  . Adenotonsillar hypertrophy 04/08/2020    Past Surgical History:  Procedure Laterality Date  . NO PAST SURGERIES    . TONSILLECTOMY AND ADENOIDECTOMY Bilateral 04/08/2020   Procedure: TONSILLECTOMY AND ADENOIDECTOMY;  Surgeon: Osborn Coho, MD;  Location: [facility];  Service: ENT;  Laterality: Bilateral;    OB History    Gravida  0   Para  0   Term  0   Preterm  0   AB  0   Living  0     SAB  0   IAB  0   Ectopic  0   Multiple  0   Live Births  0            Home Medications    Prior to Admission medications   Medication Sig Start Date End Date Taking? Authorizing Provider  Cetirizine HCl 10 MG CAPS Take 1 capsule (10 mg total) by mouth daily for 10 days. 07/04/20 07/14/20 Yes Larkyn Greenberger C, PA-C  ibuprofen (ADVIL) 800 MG tablet Take 1 tablet (800 mg total) by mouth 3 (three) times daily. 07/04/20  Yes Oria Klimas C, PA-C  ondansetron (ZOFRAN ODT) 4 MG disintegrating tablet Take 1 tablet (4 mg total) by mouth every 8 (eight) hours as needed for nausea or vomiting. 07/04/20  Yes Lamyah Creed C, PA-C  diphenhydrAMINE (BENADRYL) 25 MG tablet Take 50 mg by mouth every 6 (six) hours as needed (for reactions to  environmental allergies).   01/21/19  [provider]    Family History Family History  Problem Relation Age of Onset  . Healthy Mother   . Hypertension Maternal Grandmother   . Hypertension Maternal Grandfather   . COPD Maternal Grandfather   . Heart Problems Father     Social History Social History   Tobacco Use  . Smoking status: Former Smoker    Types: Cigarettes  . Smokeless tobacco: Never Used  Vaping Use  . Vaping Use: Some days  Substance Use Topics  . Alcohol use: Never  . Drug use: No     Allergies   Patient has no known allergies.   Review of Systems Review of Systems  Constitutional: Negative for activity change, appetite change, chills, fatigue and fever.  HENT: Positive for sore throat. Negative for congestion, ear pain, rhinorrhea, sinus pressure and trouble swallowing.   Eyes: Negative for discharge and redness.  Respiratory: Negative for cough, chest tightness and shortness of breath.   Cardiovascular: Negative for chest pain.  Gastrointestinal: Positive for nausea. Negative for abdominal pain, diarrhea and vomiting.  Musculoskeletal: Negative for myalgias.  Skin: Negative for rash.  Neurological: Negative for dizziness, light-headedness and headaches.     Physical  Exam Triage Vital Signs ED Triage Vitals  Enc Vitals Group     BP      Pulse      Resp      Temp      Temp src      SpO2      Weight      Height      Head Circumference      Peak Flow      Pain Score      Pain Loc      Pain Edu?      Excl. in GC?    No data found.  Updated Vital Signs BP (!) 116/59 (BP Location: Left Arm)   Pulse 72   Temp 98.1 F (36.7 C) (Oral)   Resp 18   SpO2 98%   Visual Acuity Right Eye Distance:   Left Eye Distance:   Bilateral Distance:    Right Eye Near:   Left Eye Near:    Bilateral Near:     Physical Exam Vitals and nursing note reviewed.  Constitutional:      Appearance: She is well-developed.     Comments: No  acute distress  HENT:     Head: Normocephalic and atraumatic.     Ears:     Comments: Bilateral ears without tenderness to palpation of external auricle, tragus and mastoid, EAC's without erythema or swelling, TM's with good bony landmarks and cone of light. Non erythematous.     Nose: Nose normal.     Mouth/Throat:     Comments: Oral mucosa pink and moist, no tonsillar enlargement or exudate. Posterior pharynx patent and nonerythematous, no uvula deviation or swelling. Normal phonation. Eyes:     Conjunctiva/sclera: Conjunctivae normal.  Cardiovascular:     Rate and Rhythm: Normal rate.  Pulmonary:     Effort: Pulmonary effort is normal. No respiratory distress.     Comments: Breathing comfortably at rest, CTABL, no wheezing, rales or other adventitious sounds auscultated Abdominal:     General: There is no distension.  Musculoskeletal:        General: Normal range of motion.     Cervical back: Neck supple.  Skin:    General: Skin is warm and dry.  Neurological:     Mental Status: She is alert and oriented to person, place, and time.      UC Treatments / Results  Labs (all labs ordered are listed, but only abnormal results are displayed) Labs Reviewed  CULTURE, GROUP A STREP [facility])  POCT RAPID STREP A (OFFICE)    EKG   Radiology No results found.  Procedures Procedures (including critical care time)  Medications Ordered in UC Medications - No data to display  Initial Impression / Assessment and Plan / UC Course  I have reviewed the triage vital signs and the nursing notes.  Pertinent labs & imaging results that were available during my care of the patient were reviewed by me and considered in my medical decision making (see chart for details).     Sore throat- strep test negative, recommending symptomatic and supportive care as likely viral etiology.  Exam reassuring.  Zofran for nausea.  Discussed strict return precautions. Patient verbalized understanding  and is agreeable with plan.  Final Clinical Impressions(s) / UC Diagnoses   Final diagnoses:  Sore throat  Nausea without vomiting     Discharge Instructions     Sore Throat  Your rapid strep tested Negative today. Symptoms likely viral and should improve over  the next 3-4 days  Please continue Tylenol or Ibuprofen for fever and pain. May try salt water gargles, cepacol lozenges, throat spray, or OTC cold relief medicine for throat discomfort. If you also have congestion take a daily anti-histamine like Zyrtec, Claritin, and a oral decongestant to help with post nasal drip that may be irritating your throat.   Stay hydrated and drink plenty of fluids to keep your throat coated relieve irritation.     ED Prescriptions    Medication Sig Dispense Auth. Provider   ibuprofen (ADVIL) 800 MG tablet Take 1 tablet (800 mg total) by mouth 3 (three) times daily. 21 tablet Kouper Spinella C, PA-C   Cetirizine HCl 10 MG CAPS Take 1 capsule (10 mg total) by mouth daily for 10 days. 15 capsule Davonne Baby C, PA-C   ondansetron (ZOFRAN ODT) 4 MG disintegrating tablet Take 1 tablet (4 mg total) by mouth every 8 (eight) hours as needed for nausea or vomiting. 20 tablet Chandlar Guice, [place] C, PA-C     PDMP not reviewed this encounter.   Lew Dawes, [place] 07/04/20 1432

## 2020-07-07 LAB — CULTURE, GROUP A STREP (THRC)

## 2020-08-07 ENCOUNTER — Emergency Department (HOSPITAL_BASED_OUTPATIENT_CLINIC_OR_DEPARTMENT_OTHER)
Admission: EM | Admit: 2020-08-07 | Discharge: 2020-08-07 | Disposition: A | Discharge status: Home / Self Care | Payer: Medicaid Other | Attending: Emergency Medicine | Admitting: Emergency Medicine

## 2020-08-07 ENCOUNTER — Other Ambulatory Visit: Payer: Self-pay

## 2020-08-07 ENCOUNTER — Ambulatory Visit
Admission: EM | Admit: 2020-08-07 | Discharge: 2020-08-07 | Disposition: A | Discharge status: Other Acute Inpatient Hospital | Payer: Medicaid Other | Attending: Family Medicine | Admitting: Family Medicine

## 2020-08-07 ENCOUNTER — Encounter (HOSPITAL_BASED_OUTPATIENT_CLINIC_OR_DEPARTMENT_OTHER): Payer: Self-pay | Admitting: *Deleted

## 2020-08-07 ENCOUNTER — Emergency Department (HOSPITAL_BASED_OUTPATIENT_CLINIC_OR_DEPARTMENT_OTHER): Payer: Medicaid Other

## 2020-08-07 DIAGNOSIS — B9689 Other specified bacterial agents as the cause of diseases classified elsewhere: Secondary | ICD-10-CM

## 2020-08-07 DIAGNOSIS — N83291 Other ovarian cyst, right side: Secondary | ICD-10-CM | POA: Diagnosis not present

## 2020-08-07 DIAGNOSIS — N760 Acute vaginitis: Secondary | ICD-10-CM | POA: Insufficient documentation

## 2020-08-07 DIAGNOSIS — N83209 Unspecified ovarian cyst, unspecified side: Secondary | ICD-10-CM

## 2020-08-07 DIAGNOSIS — R1110 Vomiting, unspecified: Secondary | ICD-10-CM | POA: Insufficient documentation

## 2020-08-07 DIAGNOSIS — R112 Nausea with vomiting, unspecified: Secondary | ICD-10-CM

## 2020-08-07 DIAGNOSIS — R1031 Right lower quadrant pain: Secondary | ICD-10-CM

## 2020-08-07 LAB — WET PREP, GENITAL
Sperm: NONE SEEN
Trich, Wet Prep: NONE SEEN
Yeast Wet Prep HPF POC: NONE SEEN

## 2020-08-07 LAB — CBC WITH DIFFERENTIAL/PLATELET
Abs Immature Granulocytes: 0.01 10*3/uL (ref 0.00–0.07)
Basophils Absolute: 0 10*3/uL (ref 0.0–0.1)
Basophils Relative: 1 %
Eosinophils Absolute: 0.1 10*3/uL (ref 0.0–0.5)
Eosinophils Relative: 2 %
HCT: 37.8 % (ref 36.0–46.0)
Hemoglobin: 12.6 g/dL (ref 12.0–15.0)
Immature Granulocytes: 0 %
Lymphocytes Relative: 38 %
Lymphs Abs: 2.6 10*3/uL (ref 0.7–4.0)
MCH: 31 pg (ref 26.0–34.0)
MCHC: 33.3 g/dL (ref 30.0–36.0)
MCV: 92.9 fL (ref 80.0–100.0)
Monocytes Absolute: 0.6 10*3/uL (ref 0.1–1.0)
Monocytes Relative: 9 %
Neutro Abs: 3.5 10*3/uL (ref 1.7–7.7)
Neutrophils Relative %: 50 %
Platelets: 221 10*3/uL (ref 150–400)
RBC: 4.07 MIL/uL (ref 3.87–5.11)
RDW: 14.3 % (ref 11.5–15.5)
WBC: 6.9 10*3/uL (ref 4.0–10.5)
nRBC: 0 % (ref 0.0–0.2)

## 2020-08-07 LAB — URINALYSIS, ROUTINE W REFLEX MICROSCOPIC
Bilirubin Urine: NEGATIVE
Glucose, UA: NEGATIVE mg/dL
Hgb urine dipstick: NEGATIVE
Ketones, ur: NEGATIVE mg/dL
Nitrite: NEGATIVE
Protein, ur: NEGATIVE mg/dL
Specific Gravity, Urine: 1.024 (ref 1.005–1.030)
pH: 8.5 — ABNORMAL HIGH (ref 5.0–8.0)

## 2020-08-07 LAB — POCT URINALYSIS DIP (MANUAL ENTRY)
Bilirubin, UA: NEGATIVE
Blood, UA: NEGATIVE
Glucose, UA: NEGATIVE mg/dL
Ketones, POC UA: NEGATIVE mg/dL
Leukocytes, UA: NEGATIVE
Nitrite, UA: NEGATIVE
Protein Ur, POC: NEGATIVE mg/dL
Spec Grav, UA: 1.025 (ref 1.010–1.025)
Urobilinogen, UA: 0.2 E.U./dL
pH, UA: 6.5 (ref 5.0–8.0)

## 2020-08-07 LAB — COMPREHENSIVE METABOLIC PANEL
ALT: 6 U/L (ref 0–44)
AST: 9 U/L — ABNORMAL LOW (ref 15–41)
Albumin: 4.4 g/dL (ref 3.5–5.0)
Alkaline Phosphatase: 35 U/L — ABNORMAL LOW (ref 38–126)
Anion gap: 8 (ref 5–15)
BUN: 10 mg/dL (ref 6–20)
CO2: 27 mmol/L (ref 22–32)
Calcium: 9.8 mg/dL (ref 8.9–10.3)
Chloride: 105 mmol/L (ref 98–111)
Creatinine, Ser: 0.76 mg/dL (ref 0.44–1.00)
GFR, Estimated: >60 mL/min (ref >60)
Glucose, Bld: 92 mg/dL (ref 70–99)
Potassium: 3.8 mmol/L (ref 3.5–5.1)
Sodium: 140 mmol/L (ref 135–145)
Total Bilirubin: 0.7 mg/dL (ref 0.3–1.2)
Total Protein: 7.3 g/dL (ref 6.5–8.1)

## 2020-08-07 LAB — LIPASE, BLOOD: Lipase: 11 U/L (ref 11–51)

## 2020-08-07 LAB — POCT URINE PREGNANCY: Preg Test, Ur: NEGATIVE

## 2020-08-07 MED ORDER — SODIUM CHLORIDE 0.9 % IV BOLUS
1000.0000 mL | Freq: Once | INTRAVENOUS | Status: AC
  Administered 2020-08-07: 13:00:00 1000 mL via INTRAVENOUS

## 2020-08-07 MED ORDER — ONDANSETRON HCL 4 MG/2ML IJ SOLN
4.0000 mg | Freq: Once | INTRAMUSCULAR | Status: AC
  Administered 2020-08-07: 13:00:00 4 mg via INTRAVENOUS
  Filled 2020-08-07: qty 2

## 2020-08-07 MED ORDER — ONDANSETRON 4 MG PO TBDP: 4.0000 mg | ORAL_TABLET | Freq: Three times a day (TID) | ORAL | 0 refills | Status: DC | PRN

## 2020-08-07 MED ORDER — IBUPROFEN 600 MG PO TABS: 600.0000 mg | ORAL_TABLET | Freq: Four times a day (QID) | ORAL | 0 refills | Status: DC | PRN

## 2020-08-07 MED ORDER — IOHEXOL 300 MG/ML  SOLN
75.0000 mL | Freq: Once | INTRAMUSCULAR | Status: AC | PRN
  Administered 2020-08-07: 75 mL via INTRAVENOUS

## 2020-08-07 MED ORDER — METRONIDAZOLE 500 MG PO TABS: 500.0000 mg | ORAL_TABLET | Freq: Two times a day (BID) | ORAL | 0 refills | Status: DC

## 2020-08-07 NOTE — ED Notes (Signed)
Patient is being discharged from the Urgent Care and sent to the Emergency Department via personal vehicle . Per Judeth Cornfield NP, patient is in need of higher level of care due to ABD Pain. Patient is aware and verbalizes understanding of plan of care.  Vitals:   08/07/20 1039  BP: 103/62  Pulse: 78  Resp: 16  Temp: 97.9 F (36.6 C)  SpO2: 97%

## 2020-08-07 NOTE — ED Provider Notes (Signed)
[facility] EMERGENCY DEPT Provider Note   CSN: [id number] Arrival date & time: 08/07/20  1133     History Chief Complaint  Patient presents with   Abdominal Pain    Debbie Miller is a 20 y.o. female.  Pt presents to the ED today with RLQ abd pain and vomiting.  Pt went to UC and was sent here for a CT.  Preg there neg.  Pain and vomiting started last night.  Family hx ovarian cysts.  No prior abdominal surgeries.      Past Medical History:  Diagnosis Date   Anemia    Depression    PAC (premature atrial contraction)    Tonsillitis     Patient Active Problem List   Diagnosis Date Noted   Acute tonsillitis 04/08/2020   Adenotonsillar hypertrophy 04/08/2020    Past Surgical History:  Procedure Laterality Date   NO PAST SURGERIES     TONSILLECTOMY AND ADENOIDECTOMY Bilateral 04/08/2020   Procedure: TONSILLECTOMY AND ADENOIDECTOMY;  Surgeon: Osborn Coho, MD;  Location: [facility];  Service: ENT;  Laterality: Bilateral;     OB History     Gravida  0   Para  0   Term  0   Preterm  0   AB  0   Living  0      SAB  0   IAB  0   Ectopic  0   Multiple  0   Live Births  0           Family History  Problem Relation Age of Onset   Healthy Mother    Hypertension Maternal Grandmother    Hypertension Maternal Grandfather    COPD Maternal Grandfather    Heart Problems Father     Social History   Tobacco Use   Smoking status: Never   Smokeless tobacco: Never  Vaping Use   Vaping Use: Some days  Substance Use Topics   Alcohol use: Yes    Comment: occasionally   Drug use: Yes    Types: Marijuana    Comment: last 08/01/2020    Home Medications Prior to Admission medications   Medication Sig Start Date End Date Taking? Authorizing Provider  ibuprofen (ADVIL) 600 MG tablet Take 1 tablet (600 mg total) by mouth every 6 (six) hours as needed. 08/07/20  Yes Jacalyn Lefevre, MD  metroNIDAZOLE (FLAGYL) 500 MG tablet  Take 1 tablet (500 mg total) by mouth 2 (two) times daily. 08/07/20  Yes Jacalyn Lefevre, MD  ondansetron (ZOFRAN ODT) 4 MG disintegrating tablet Take 1 tablet (4 mg total) by mouth every 8 (eight) hours as needed for nausea or vomiting. 08/07/20  Yes Jacalyn Lefevre, MD  diphenhydrAMINE (BENADRYL) 25 MG tablet Take 50 mg by mouth every 6 (six) hours as needed (for reactions to environmental allergies).   01/21/19  [provider]    Allergies    Patient has no known allergies.  Review of Systems   Review of Systems  Gastrointestinal:  Positive for abdominal pain, nausea and vomiting.  All other systems reviewed and are negative.  Physical Exam Updated Vital Signs BP 107/67 (BP Location: Right Arm)   Pulse 65   Temp 98.4 F (36.9 C)   Resp 16   Ht 5\' 8"  (1.727 m)   Wt 62.1 kg   LMP 07/08/2020   SpO2 100%   BMI 20.83 kg/m   Physical Exam Vitals and nursing note reviewed. Exam conducted with a chaperone present.  Constitutional:  Appearance: She is well-developed.  HENT:     Head: Normocephalic and atraumatic.     Mouth/Throat:     Mouth: Mucous membranes are moist.     Pharynx: Oropharynx is clear.  Eyes:     Extraocular Movements: Extraocular movements intact.     Pupils: Pupils are equal, round, and reactive to light.  Cardiovascular:     Rate and Rhythm: Normal rate and regular rhythm.     Heart sounds: Normal heart sounds.  Pulmonary:     Effort: Pulmonary effort is normal.     Breath sounds: Normal breath sounds.  Abdominal:     General: Abdomen is flat. Bowel sounds are normal.     Palpations: Abdomen is soft.     Tenderness: There is abdominal tenderness in the right lower quadrant.  Genitourinary:    Exam position: Lithotomy position.     Cervix: Discharge present.     Uterus: Normal.      Adnexa:        Right: Tenderness present.   Skin:    General: Skin is warm.     Capillary Refill: Capillary refill takes less than 2 seconds.  Neurological:      General: No focal deficit present.     Mental Status: She is alert and oriented to person, place, and time.  Psychiatric:        Mood and Affect: Mood normal.        Behavior: Behavior normal.    ED Results / Procedures / Treatments   Labs (all labs ordered are listed, but only abnormal results are displayed) Labs Reviewed  WET PREP, GENITAL - Abnormal; Notable for the following components:      Result Value   Clue Cells Wet Prep HPF POC PRESENT (*)    WBC, Wet Prep HPF POC MANY (*)    All other components within normal limits  COMPREHENSIVE METABOLIC PANEL - Abnormal; Notable for the following components:   AST 9 (*)    Alkaline Phosphatase 35 (*)    All other components within normal limits  CBC WITH DIFFERENTIAL/PLATELET  LIPASE, BLOOD  URINALYSIS, ROUTINE W REFLEX MICROSCOPIC  GC/CHLAMYDIA PROBE AMP ([place]) NOT AT [facility]    EKG None  Radiology CT ABDOMEN PELVIS W CONTRAST  Result Date: 08/07/2020 CLINICAL DATA:  Right lower quadrant abdominal pain since last night. EXAM: CT ABDOMEN AND PELVIS WITH CONTRAST TECHNIQUE: Multidetector CT imaging of the abdomen and pelvis was performed using the standard protocol following bolus administration of intravenous contrast. CONTRAST:  56mL OMNIPAQUE IOHEXOL 300 MG/ML  SOLN COMPARISON:  None. FINDINGS: Lower chest: No acute abnormality. Hepatobiliary: No focal liver abnormality is seen. No gallstones, gallbladder wall thickening, or biliary dilatation. Pancreas: Unremarkable. No pancreatic ductal dilatation or surrounding inflammatory changes. Spleen: Normal in size without focal abnormality. Adrenals/Urinary Tract: The adrenal glands and left kidney are unremarkable. Small right renal cyst. No renal calculi or hydronephrosis. The bladder is largely decompressed. Stomach/Bowel: Stomach is within normal limits. Appendix appears normal. No evidence of bowel wall thickening, distention, or inflammatory changes. Vascular/Lymphatic: No  significant vascular findings are present. No enlarged abdominal or pelvic lymph nodes. Reproductive: Uterus and bilateral adnexa are unremarkable. Other: Small amount of low to intermediate density fluid in the pelvis. No pneumoperitoneum. Musculoskeletal: No acute or significant osseous findings. IMPRESSION: 1. No acute intra-abdominal process. Normal appendix. 2. Small amount of low to intermediate density fluid in the pelvis, which could reflect a ruptured ovarian cyst. Electronically Signed  By: Obie Dredge M.D.   On: 08/07/2020 12:55    Procedures Procedures   Medications Ordered in ED Medications  sodium chloride 0.9 % bolus 1,000 mL (1,000 mLs Intravenous New Bag/Given 08/07/20 1310)  ondansetron (ZOFRAN) injection 4 mg (4 mg Intravenous Given 08/07/20 1311)  iohexol (OMNIPAQUE) 300 MG/ML solution 75 mL (75 mLs Intravenous Contrast Given 08/07/20 1217)    ED Course  I have reviewed the triage vital signs and the nursing notes.  Pertinent labs & imaging results that were available during my care of the patient were reviewed by me and considered in my medical decision making (see chart for details).    MDM Rules/Calculators/A&P                          Pt is tolerating fluids.  She is feeling better after the IVFs and zofran.  She does have BV, but due to the vomiting, I am going to have her start the flagyl tomorrow.  Pt has [facility] a gyn at [facility].  She is to f/u with gyn.  Return if worse. Final Clinical Impression(s) / ED Diagnoses Final diagnoses:  Right lower quadrant abdominal pain  Ruptured ovarian cyst  Bacterial vaginosis    Rx / DC Orders ED Discharge Orders          Ordered    metroNIDAZOLE (FLAGYL) 500 MG tablet  2 times daily        08/07/20 1333    ondansetron (ZOFRAN ODT) 4 MG disintegrating tablet  Every 8 hours PRN        08/07/20 1333    ibuprofen (ADVIL) 600 MG tablet  Every 6 hours PRN        08/07/20 1333             Jacalyn Lefevre,  MD 08/07/20 1335

## 2020-08-07 NOTE — ED Triage Notes (Signed)
Pt present abdominal pain with vomiting, symptoms started last night. Pt states her abdomen pain is on the right lower quadrant.

## 2020-08-07 NOTE — ED Provider Notes (Signed)
[facility]   [id number] 08/07/20 Arrival Time: 3267  CC: ABDOMINAL PAIN  SUBJECTIVE:  Debbie Miller is a 20 y.o. female who presents with abdominal pain, nausea, vomiting since last night. She still has her appendix. Denies hx ovarian cysts, other pelvic or reproductive history.Denies a precipitating event, trauma, close contacts with similar symptoms, recent travel or antibiotic use. Reports sharp pain to RLQ with radiation across the lower abdomen. Has taken OTC medications for this without relief. Denies alleviating or aggravating factors. Denies similar symptoms in the past. Last BM 2 days ago.    Denies fever, chills, weight changes, chest pain, SOB, diarrhea, hematochezia, melena, dysuria, difficulty urinating, increased frequency or urgency, flank pain, loss of bowel or bladder function, vaginal discharge, vaginal odor, vaginal bleeding, dyspareunia, pelvic pain.     Patient's last menstrual period was 06/27/2020.  ROS: As per HPI.  All other pertinent ROS negative.     Past Medical History:  Diagnosis Date   Anemia    Depression    PAC (premature atrial contraction)    Tonsillitis    Past Surgical History:  Procedure Laterality Date   NO PAST SURGERIES     TONSILLECTOMY AND ADENOIDECTOMY Bilateral 04/08/2020   Procedure: TONSILLECTOMY AND ADENOIDECTOMY;  Surgeon: Osborn Coho, MD;  Location: [facility];  Service: ENT;  Laterality: Bilateral;   No Known Allergies No current facility-administered medications on file prior to encounter.   Current Outpatient Medications on File Prior to Encounter  Medication Sig Dispense Refill   Cetirizine HCl 10 MG CAPS Take 1 capsule (10 mg total) by mouth daily for 10 days. 15 capsule 0   ibuprofen (ADVIL) 800 MG tablet Take 1 tablet (800 mg total) by mouth 3 (three) times daily. 21 tablet 0   ondansetron (ZOFRAN ODT) 4 MG disintegrating tablet Take 1 tablet (4 mg total) by mouth every 8 (eight) hours as needed  for nausea or vomiting. 20 tablet 0   [DISCONTINUED] diphenhydrAMINE (BENADRYL) 25 MG tablet Take 50 mg by mouth every 6 (six) hours as needed (for reactions to environmental allergies).      Social History   Socioeconomic History   Marital status: Single    Spouse name: Not on file   Number of children: Not on file   Years of education: Not on file   Highest education level: Not on file  Occupational History   Not on file  Tobacco Use   Smoking status: Former    Pack years: 0.00    Types: Cigarettes   Smokeless tobacco: Never  Vaping Use   Vaping Use: Some days  Substance and Sexual Activity   Alcohol use: Never   Drug use: No   Sexual activity: Yes    Birth control/protection: None  Other Topics Concern   Not on file  Social History Narrative   Not on file   Social Determinants of Health   Financial Resource Strain: Not on file  Food Insecurity: Not on file  Transportation Needs: Not on file  Physical Activity: Not on file  Stress: Not on file  Social Connections: Not on file  Intimate Partner Violence: Not on file   Family History  Problem Relation Age of Onset   Healthy Mother    Hypertension Maternal Grandmother    Hypertension Maternal Grandfather    COPD Maternal Grandfather    Heart Problems Father      OBJECTIVE:  Vitals:   08/07/20 1039  BP: 103/62  Pulse: 78  Resp:  16  Temp: 97.9 F (36.6 C)  TempSrc: Oral  SpO2: 97%    General appearance: Alert; NAD HEENT: NCAT.  Oropharynx clear.  Lungs: clear to auscultation bilaterally without adventitious breath sounds Heart: regular rate and rhythm.  Radial pulses 2+ symmetrical bilaterally Abdomen: soft, non-distended; hypoactive active bowel sounds; entire right abdomen, periumbilical area exquisitely tender to light palpation; guarding abdomen Back: no CVA tenderness Extremities: no edema; symmetrical with no gross deformities Skin: warm and dry Neurologic: normal gait Psychological: alert and  cooperative; normal mood and affect  LABS: Results for orders placed or performed during the hospital encounter of 08/07/20 (from the past 24 hour(s))  POCT urinalysis dipstick     Status: None   Collection Time: 08/07/20 10:52 AM  Result Value Ref Range   Color, UA yellow yellow   Clarity, UA clear clear   Glucose, UA negative negative mg/dL   Bilirubin, UA negative negative   Ketones, POC UA negative negative mg/dL   Spec Grav, UA 0.626 9.485 - 1.025   Blood, UA negative negative   pH, UA 6.5 5.0 - 8.0   Protein Ur, POC negative negative mg/dL   Urobilinogen, UA 0.2 0.2 or 1.0 E.U./dL   Nitrite, UA Negative Negative   Leukocytes, UA Negative Negative  POCT urine pregnancy     Status: None   Collection Time: 08/07/20 10:52 AM  Result Value Ref Range   Preg Test, Ur Negative Negative    DIAGNOSTIC STUDIES: No results found.   ASSESSMENT & PLAN:  1. RLQ abdominal pain   2. Nausea and vomiting, intractability of vomiting not specified, unspecified vomiting type    Discussed that given the severity of the pain and the sudden onset, that she would be best served in the ER for further workup Discussed that we cannot rule out appendicitis vs ovarian cysts in the office  Verbalized understanding is in agreement with treatment plan Stable at discharge To ER via POV   Moshe Cipro, NP 08/07/20 1114

## 2020-08-07 NOTE — Discharge Instructions (Addendum)
Go to the ER for further evaluation

## 2020-08-07 NOTE — ED Triage Notes (Signed)
RLQ pain started last night and vomited x 4.

## 2020-08-11 LAB — GC/CHLAMYDIA PROBE AMP (~~LOC~~) NOT AT ARMC
Chlamydia: POSITIVE — AB
Comment: NEGATIVE
Comment: NORMAL
Neisseria Gonorrhea: NEGATIVE

## 2020-08-12 NOTE — ED Notes (Signed)
On this date, per Dr. Pryor Ochoa, I phone in rx for Doxy 100mg  b.i.d. x 10 days to [facility] on [facility].

## 2020-09-24 ENCOUNTER — Encounter (HOSPITAL_BASED_OUTPATIENT_CLINIC_OR_DEPARTMENT_OTHER): Payer: Self-pay | Admitting: Obstetrics and Gynecology

## 2020-09-24 ENCOUNTER — Other Ambulatory Visit: Payer: Self-pay

## 2020-09-24 ENCOUNTER — Emergency Department (HOSPITAL_BASED_OUTPATIENT_CLINIC_OR_DEPARTMENT_OTHER)
Admission: EM | Admit: 2020-09-24 | Discharge: 2020-09-24 | Disposition: A | Discharge status: Home / Self Care | Payer: Medicaid Other | Attending: Emergency Medicine | Admitting: Emergency Medicine

## 2020-09-24 ENCOUNTER — Ambulatory Visit: Payer: Self-pay | Admitting: *Deleted

## 2020-09-24 ENCOUNTER — Emergency Department (HOSPITAL_BASED_OUTPATIENT_CLINIC_OR_DEPARTMENT_OTHER): Payer: Medicaid Other

## 2020-09-24 DIAGNOSIS — R509 Fever, unspecified: Secondary | ICD-10-CM | POA: Diagnosis not present

## 2020-09-24 DIAGNOSIS — R1032 Left lower quadrant pain: Secondary | ICD-10-CM | POA: Insufficient documentation

## 2020-09-24 DIAGNOSIS — K5900 Constipation, unspecified: Secondary | ICD-10-CM | POA: Insufficient documentation

## 2020-09-24 DIAGNOSIS — N898 Other specified noninflammatory disorders of vagina: Secondary | ICD-10-CM | POA: Insufficient documentation

## 2020-09-24 DIAGNOSIS — R102 Pelvic and perineal pain: Secondary | ICD-10-CM

## 2020-09-24 DIAGNOSIS — R1030 Lower abdominal pain, unspecified: Secondary | ICD-10-CM

## 2020-09-24 LAB — COMPREHENSIVE METABOLIC PANEL
ALT: 8 U/L (ref 0–44)
AST: 12 U/L — ABNORMAL LOW (ref 15–41)
Albumin: 4.8 g/dL (ref 3.5–5.0)
Alkaline Phosphatase: 44 U/L (ref 38–126)
Anion gap: 10 (ref 5–15)
BUN: 11 mg/dL (ref 6–20)
CO2: 27 mmol/L (ref 22–32)
Calcium: 10 mg/dL (ref 8.9–10.3)
Chloride: 103 mmol/L (ref 98–111)
Creatinine, Ser: 0.66 mg/dL (ref 0.44–1.00)
GFR, Estimated: >60 mL/min (ref >60)
Glucose, Bld: 96 mg/dL (ref 70–99)
Potassium: 3.5 mmol/L (ref 3.5–5.1)
Sodium: 140 mmol/L (ref 135–145)
Total Bilirubin: 0.6 mg/dL (ref 0.3–1.2)
Total Protein: 7.7 g/dL (ref 6.5–8.1)

## 2020-09-24 LAB — URINALYSIS, ROUTINE W REFLEX MICROSCOPIC
Bilirubin Urine: NEGATIVE
Glucose, UA: NEGATIVE mg/dL
Hgb urine dipstick: NEGATIVE
Ketones, ur: 15 mg/dL — AB
Leukocytes,Ua: NEGATIVE
Nitrite: NEGATIVE
Specific Gravity, Urine: 1.022 (ref 1.005–1.030)
pH: 6 (ref 5.0–8.0)

## 2020-09-24 LAB — WET PREP, GENITAL
Clue Cells Wet Prep HPF POC: NONE SEEN
Sperm: NONE SEEN
Trich, Wet Prep: NONE SEEN
Yeast Wet Prep HPF POC: NONE SEEN

## 2020-09-24 LAB — LIPASE, BLOOD: Lipase: 14 U/L (ref 11–51)

## 2020-09-24 LAB — CBC
HCT: 41.1 % (ref 36.0–46.0)
Hemoglobin: 13.8 g/dL (ref 12.0–15.0)
MCH: 30.6 pg (ref 26.0–34.0)
MCHC: 33.6 g/dL (ref 30.0–36.0)
MCV: 91.1 fL (ref 80.0–100.0)
Platelets: 270 10*3/uL (ref 150–400)
RBC: 4.51 MIL/uL (ref 3.87–5.11)
RDW: 14.4 % (ref 11.5–15.5)
WBC: 7 10*3/uL (ref 4.0–10.5)
nRBC: 0 % (ref 0.0–0.2)

## 2020-09-24 LAB — PREGNANCY, URINE: Preg Test, Ur: NEGATIVE

## 2020-09-24 NOTE — ED Provider Notes (Signed)
[facility] EMERGENCY DEPT Provider Note   CSN: [id number] Arrival date & time: 09/24/20  1120     History Chief Complaint  Patient presents with   Abdominal Pain    Debbie Miller is a 20 y.o. female.  She is here with a complaint of sharp stabbing left lower quadrant abdominal pain that started yesterday.  She was here a month ago for stabbing right lower quadrant abdominal pain and thought to possibly had a ruptured ovarian cyst.  She said her period was abnormal this month with only a little bit of spotting.  Negative home pregnancy test.  She has been having some watery vaginal discharge.  Low-grade fever.  Little bit of diarrhea after having been constipated prior.  No urinary symptoms.  The history is provided by the patient.  Abdominal Pain Pain location:  LLQ Pain quality: sharp   Pain radiates to:  Back Pain severity now: 8/10. Onset quality:  Gradual Duration:  2 days Timing:  Intermittent Progression:  Unchanged Chronicity:  New Context: not trauma   Relieved by:  Nothing Worsened by:  Position changes Ineffective treatments:  Acetaminophen Associated symptoms: constipation, diarrhea, fever and vaginal discharge   Associated symptoms: no chest pain, no cough, no dysuria, no nausea, no shortness of breath, no sore throat and no vomiting       Past Medical History:  Diagnosis Date   Anemia    Depression    PAC (premature atrial contraction)    Tonsillitis     Patient Active Problem List   Diagnosis Date Noted   Acute tonsillitis 04/08/2020   Adenotonsillar hypertrophy 04/08/2020    Past Surgical History:  Procedure Laterality Date   NO PAST SURGERIES     TONSILLECTOMY AND ADENOIDECTOMY Bilateral 04/08/2020   Procedure: TONSILLECTOMY AND ADENOIDECTOMY;  Surgeon: Osborn Coho, MD;  Location: [facility];  Service: ENT;  Laterality: Bilateral;     OB History     Gravida  0   Para  0   Term  0   Preterm  0   AB  0    Living  0      SAB  0   IAB  0   Ectopic  0   Multiple  0   Live Births  0           Family History  Problem Relation Age of Onset   Healthy Mother    Hypertension Maternal Grandmother    Hypertension Maternal Grandfather    COPD Maternal Grandfather    Heart Problems Father     Social History   Tobacco Use   Smoking status: Never   Smokeless tobacco: Never  Vaping Use   Vaping Use: Some days   Substances: Nicotine, Flavoring  Substance Use Topics   Alcohol use: Yes    Comment: occasionally   Drug use: Yes    Types: Marijuana    Home Medications Prior to Admission medications   Medication Sig Start Date End Date Taking? Authorizing Provider  ibuprofen (ADVIL) 600 MG tablet Take 1 tablet (600 mg total) by mouth every 6 (six) hours as needed. 08/07/20   Jacalyn Lefevre, MD  metroNIDAZOLE (FLAGYL) 500 MG tablet Take 1 tablet (500 mg total) by mouth 2 (two) times daily. 08/07/20   Jacalyn Lefevre, MD  ondansetron (ZOFRAN ODT) 4 MG disintegrating tablet Take 1 tablet (4 mg total) by mouth every 8 (eight) hours as needed for nausea or vomiting. 08/07/20   Jacalyn Lefevre, MD  diphenhydrAMINE (BENADRYL) 25 MG tablet Take 50 mg by mouth every 6 (six) hours as needed (for reactions to environmental allergies).   01/21/19  [provider]    Allergies    Patient has no known allergies.  Review of Systems   Review of Systems  Constitutional:  Positive for fever.  HENT:  Negative for sore throat.   Eyes:  Negative for visual disturbance.  Respiratory:  Negative for cough and shortness of breath.   Cardiovascular:  Negative for chest pain.  Gastrointestinal:  Positive for abdominal pain, constipation and diarrhea. Negative for nausea and vomiting.  Genitourinary:  Positive for vaginal discharge. Negative for dysuria.  Musculoskeletal:  Positive for back pain.  Skin:  Negative for rash.  Neurological:  Negative for headaches.   Physical Exam Updated Vital  Signs BP 133/89 (BP Location: Right Arm)   Pulse (!) 102   Temp 98.1 F (36.7 C) (Oral)   Resp 16   Ht 5\' 8"  (1.727 m)   Wt 63.5 kg   LMP 08/16/2020 (Exact Date)   SpO2 99%   BMI 21.29 kg/m   Physical Exam Vitals and nursing note reviewed.  Constitutional:      General: She is not in acute distress.    Appearance: Normal appearance. She is well-developed.  HENT:     Head: Normocephalic and atraumatic.  Eyes:     Conjunctiva/sclera: Conjunctivae normal.  Cardiovascular:     Rate and Rhythm: Normal rate and regular rhythm.     Heart sounds: No murmur heard. Pulmonary:     Effort: Pulmonary effort is normal. No respiratory distress.     Breath sounds: Normal breath sounds.  Abdominal:     Palpations: Abdomen is soft.     Tenderness: There is abdominal tenderness in the left lower quadrant. There is no guarding or rebound.  Genitourinary:    Comments: Pelvic exam with Tonya as chaperone.  Normal external genitalia.  Speculum exam shows mild amount of white thin discharge.  No cervical motion tenderness.  She is tender in both adnexa left greater than right although no masses appreciated.  Patient tolerated procedure well. Musculoskeletal:        General: No deformity or signs of injury. Normal range of motion.     Cervical back: Neck supple.  Skin:    General: Skin is warm and dry.  Neurological:     General: No focal deficit present.     Mental Status: She is alert.     Gait: Gait normal.    ED Results / Procedures / Treatments   Labs (all labs ordered are listed, but only abnormal results are displayed) Labs Reviewed  WET PREP, GENITAL - Abnormal; Notable for the following components:      Result Value   WBC, Wet Prep HPF POC MANY (*)    All other components within normal limits  COMPREHENSIVE METABOLIC PANEL - Abnormal; Notable for the following components:   AST 12 (*)    All other components within normal limits  URINALYSIS, ROUTINE W REFLEX MICROSCOPIC -  Abnormal; Notable for the following components:   Ketones, ur 15 (*)    Protein, ur TRACE (*)    All other components within normal limits  LIPASE, BLOOD  CBC  PREGNANCY, URINE  GC/CHLAMYDIA PROBE AMP ([place]) NOT AT [facility]    EKG None  Radiology [facility] PELVIC COMPLETE W TRANSVAGINAL AND TORSION R/O  Result Date: 09/24/2020 CLINICAL DATA:  Pelvic pain, LEFT lower quadrant pain for  2 days, LMP 08/17/2019 EXAM: TRANSABDOMINAL AND TRANSVAGINAL ULTRASOUND OF PELVIS DOPPLER ULTRASOUND OF OVARIES TECHNIQUE: Both transabdominal and transvaginal ultrasound examinations of the pelvis were performed. Transabdominal technique was performed for global imaging of the pelvis including uterus, ovaries, adnexal regions, and pelvic cul-de-sac. It was necessary to proceed with endovaginal exam following the transabdominal exam to visualize the lower uterine segment, endometrium, and adnexa. Color and duplex Doppler ultrasound was utilized to evaluate blood flow to the ovaries. COMPARISON:  None FINDINGS: Uterus Measurements: 8.2 x 3.5 x 4.8 cm = volume: 73 mL. Anteverted. Normal morphology without mass Endometrium Thickness: 13 mm.  No endometrial fluid or focal abnormality Right ovary Measurements: 4.6 x 1.8 x 1.9 cm = volume: 8.2 mL. Normal morphology without mass. Internal blood flow present on color Doppler imaging. Left ovary Measurements: 4.8 x 2.3 x 3.0 cm = volume: 17.3 mL. Normal morphology without mass. Internal blood flow present on color Doppler imaging. Pulsed Doppler evaluation of both ovaries demonstrates normal low-resistance arterial and venous waveforms. Other findings Trace nonspecific free pelvic fluid. No adnexal masses. IMPRESSION: Normal exam. Electronically Signed   By: Ulyses Southward M.D.   On: 09/24/2020 13:22    Procedures Procedures   Medications Ordered in ED Medications - No data to display  ED Course  I have reviewed the triage vital signs and the nursing notes.  Pertinent labs &  imaging results that were available during my care of the patient were reviewed by me and considered in my medical decision making (see chart for details).  Clinical Course as of 09/24/20 1713  Thu Sep 24, 2020  1338 Reviewed work-up with patient and she is comfortable plan for discharge.  Recommended follow-up with her PCP and gynecologist.  Return instructions discussed [MB]    Clinical Course User Index [MB] Terrilee Files, MD   MDM Rules/Calculators/A&P                          This patient complains of left lower quadrant abdominal pain, vaginal discharge, light.; this involves an extensive number of treatment Options and is a complaint that carries with it a high risk of complications and Morbidity. The differential includes ectopic, ovarian torsion, ovarian cyst, PID, vaginitis, UTI, constipation  I ordered, reviewed and interpreted labs, which included CBC with normal white count normal hemoglobin, chemistry and LFTs normal, urinalysis without clear signs of infection, pregnancy test negative, wet prep with WBCs but no clue cells yeast or trichomonas I ordered imaging studies which included pelvic ultrasound and I independently    visualized and interpreted imaging which showed no acute findings no evidence of torsion  Previous records obtained and reviewed in epic including prior ED visit for right lower quadrant abdominal pain  After the interventions stated above, I reevaluated the patient and found patient in no distress.  Abdominal exam remains benign.  Reviewed work-up with her and she is comfortable plan for outpatient follow-up with gynecology.  Return instructions discussed   Final Clinical Impression(s) / ED Diagnoses Final diagnoses:  Lower abdominal pain    Rx / DC Orders ED Discharge Orders     None        Terrilee Files, MD 09/24/20 1715

## 2020-09-24 NOTE — Discharge Instructions (Addendum)
You were seen in the emergency department for lower abdominal pain.  You had lab work urinalysis pregnancy test, pelvic exam and a pelvic ultrasound that did not show any significant abnormalities.  Your gonorrhea and Chlamydia test is pending and you can follow this up in MyChart.  If you are positive you will need antibiotics.  Please follow-up with your primary care doctor and your gynecologist.  Return to the emergency department if any worsening or concerning symptoms

## 2020-09-24 NOTE — ED Notes (Signed)
Dc home the patient verbalized understanding of dc instrucitons.

## 2020-09-24 NOTE — Telephone Encounter (Signed)
Reason for Disposition  [1] MODERATE pain (e.g., interferes with normal activities) AND [2] pain comes and goes (cramps) AND [3] present > 24 hours  (Exception: pain with Vomiting or Diarrhea - see that Guideline)  Answer Assessment - Initial Assessment Questions 1. LOCATION: "Where does it hurt?"      Left side-lower abdomen 2. RADIATION: "Does the pain shoot anywhere else?" (e.g., chest, back)     Back pain-middle 3. ONSET: "When did the pain begin?" (e.g., minutes, hours or days ago)      Yesterday am 4. SUDDEN: "Gradual or sudden onset?"     gradual 5. PATTERN "Does the pain come and go, or is it constant?"    - If constant: "Is it getting better, staying the same, or worsening?"      (Note: Constant means the pain never goes away completely; most serious pain is constant and it progresses)     - If intermittent: "How long does it last?" "Do you have pain now?"     (Note: Intermittent means the pain goes away completely between bouts)     Comes and goes, until she changes position,yes 6. SEVERITY: "How bad is the pain?"  (e.g., Scale 1-10; mild, moderate, or severe)   - MILD (1-3): doesn't interfere with normal activities, abdomen soft and not tender to touch    - MODERATE (4-7): interferes with normal activities or awakens from sleep, abdomen tender to touch    - SEVERE (8-10): excruciating pain, doubled over, unable to do any normal activities      Mild- bloated and sore 7. RECURRENT SYMPTOM: "Have you ever had this type of stomach pain before?" If Yes, ask: "When was the last time?" and "What happened that time?"      Ovarian cyst 8. CAUSE: "What do you think is causing the stomach pain?"     Ovarian cyst 9. RELIEVING/AGGRAVATING FACTORS: "What makes it better or worse?" (e.g., movement, antacids, bowel movement)     Changing position helps 10. OTHER SYMPTOMS: "Do you have any other symptoms?" (e.g., back pain, diarrhea, fever, urination pain, vomiting)       Fever- 99.7-low  grade last night, no appetite, changing from constipation to liquid diarrhea.  11. PREGNANCY: "Is there any chance you are pregnant?" "When was your last menstrual period?"       Yes- UPT negative- did spot 3 days- no cycle- 4 days late- unprotected intercourse 7/28, LMP-7/10  Protocols used: Abdominal Pain - Female-A-AH

## 2020-09-24 NOTE — Telephone Encounter (Signed)
Patient calling with LLQ pain and late menses. Patient was seen last month for ruptured ovarian cyst on opposite side. Patient states she has done UPT- negative. Last unprotected intercourse 10 days ago. Patient has reached out to her GYN- but the appointment is 1 month out. Advised UC for evaluation.

## 2020-09-24 NOTE — ED Triage Notes (Signed)
Patient reports she was seen last month for a ruptured ovarian cyst. Patient reports she had a low grad fever last night of 99.6, reports her period is 4 days late and she is having LLQ pain.

## 2020-09-25 LAB — GC/CHLAMYDIA PROBE AMP (~~LOC~~) NOT AT ARMC
Chlamydia: POSITIVE — AB
Comment: NEGATIVE
Comment: NORMAL
Neisseria Gonorrhea: NEGATIVE

## 2020-10-12 ENCOUNTER — Telehealth (HOSPITAL_BASED_OUTPATIENT_CLINIC_OR_DEPARTMENT_OTHER): Payer: Self-pay | Admitting: Emergency Medicine

## 2020-10-12 ENCOUNTER — Ambulatory Visit (HOSPITAL_COMMUNITY)
Admission: EM | Admit: 2020-10-12 | Discharge: 2020-10-12 | Disposition: A | Discharge status: Home / Self Care | Payer: Medicaid Other | Attending: Physician Assistant | Admitting: Physician Assistant

## 2020-10-12 ENCOUNTER — Encounter (HOSPITAL_COMMUNITY): Payer: Self-pay

## 2020-10-12 ENCOUNTER — Other Ambulatory Visit: Payer: Self-pay

## 2020-10-12 DIAGNOSIS — Z1152 Encounter for screening for COVID-19: Secondary | ICD-10-CM | POA: Diagnosis present

## 2020-10-12 DIAGNOSIS — J069 Acute upper respiratory infection, unspecified: Secondary | ICD-10-CM | POA: Diagnosis present

## 2020-10-12 MED ORDER — DOXYCYCLINE HYCLATE 100 MG PO CAPS: 100.0000 mg | ORAL_CAPSULE | Freq: Two times a day (BID) | ORAL | 0 refills | Status: AC

## 2020-10-12 NOTE — ED Provider Notes (Signed)
[facility]    CSN: [id number] Arrival date & time: 10/12/20  1647      History   Chief Complaint Chief Complaint  Patient presents with   Sore Throat   Migraine    HPI Debbie Miller is a 20 y.o. female.   Pt complains of sore throat, headache, and cough that started three days ago.  Denies fever, shortness of breath, n/v/d.  Reports sick contacts at work, COVID positive.  She is taking Zyrtec daily with some improvement of drainage.     Past Medical History:  Diagnosis Date   Anemia    Depression    PAC (premature atrial contraction)    Tonsillitis     Patient Active Problem List   Diagnosis Date Noted   Acute tonsillitis 04/08/2020   Adenotonsillar hypertrophy 04/08/2020    Past Surgical History:  Procedure Laterality Date   NO PAST SURGERIES     TONSILLECTOMY AND ADENOIDECTOMY Bilateral 04/08/2020   Procedure: TONSILLECTOMY AND ADENOIDECTOMY;  Surgeon: Osborn Coho, MD;  Location: [facility];  Service: ENT;  Laterality: Bilateral;    OB History     Gravida  0   Para  0   Term  0   Preterm  0   AB  0   Living  0      SAB  0   IAB  0   Ectopic  0   Multiple  0   Live Births  0            Home Medications    Prior to Admission medications   Medication Sig Start Date End Date Taking? Authorizing Provider  doxycycline (VIBRAMYCIN) 100 MG capsule Take 1 capsule (100 mg total) by mouth 2 (two) times daily for 7 days. 10/12/20 10/19/20  Tanda Rockers A, DO  ibuprofen (ADVIL) 600 MG tablet Take 1 tablet (600 mg total) by mouth every 6 (six) hours as needed. 08/07/20   Jacalyn Lefevre, MD  metroNIDAZOLE (FLAGYL) 500 MG tablet Take 1 tablet (500 mg total) by mouth 2 (two) times daily. 08/07/20   Jacalyn Lefevre, MD  ondansetron (ZOFRAN ODT) 4 MG disintegrating tablet Take 1 tablet (4 mg total) by mouth every 8 (eight) hours as needed for nausea or vomiting. 08/07/20   Jacalyn Lefevre, MD  diphenhydrAMINE (BENADRYL) 25 MG  tablet Take 50 mg by mouth every 6 (six) hours as needed (for reactions to environmental allergies).   01/21/19  [provider]    Family History Family History  Problem Relation Age of Onset   Healthy Mother    Hypertension Maternal Grandmother    Hypertension Maternal Grandfather    COPD Maternal Grandfather    Heart Problems Father     Social History Social History   Tobacco Use   Smoking status: Never   Smokeless tobacco: Never  Vaping Use   Vaping Use: Some days   Substances: Nicotine, Flavoring  Substance Use Topics   Alcohol use: Yes    Comment: occasionally   Drug use: Yes    Types: Marijuana     Allergies   Patient has no known allergies.   Review of Systems Review of Systems  Constitutional:  Negative for chills and fever.  HENT:  Positive for congestion and sore throat. Negative for ear pain.   Eyes:  Negative for pain and visual disturbance.  Respiratory:  Positive for cough. Negative for shortness of breath.   Cardiovascular:  Negative for chest pain and palpitations.  Gastrointestinal:  Negative for abdominal pain and vomiting.  Genitourinary:  Negative for dysuria and hematuria.  Musculoskeletal:  Negative for arthralgias and back pain.  Skin:  Negative for color change and rash.  Neurological:  Positive for headaches. Negative for seizures and syncope.  All other systems reviewed and are negative.   Physical Exam Triage Vital Signs ED Triage Vitals  Enc Vitals Group     BP 10/12/20 1742 110/63     Pulse Rate 10/12/20 1742 74     Resp 10/12/20 1742 18     Temp 10/12/20 1742 98.4 F (36.9 C)     Temp Source 10/12/20 1742 Oral     SpO2 10/12/20 1742 100 %     Weight --      Height --      Head Circumference --      Peak Flow --      Pain Score 10/12/20 1741 5     Pain Loc --      Pain Edu? --      Excl. in GC? --    No data found.  Updated Vital Signs BP 110/63 (BP Location: Right Arm)   Pulse 74   Temp 98.4 F (36.9 C)  (Oral)   Resp 18   SpO2 100%   Visual Acuity Right Eye Distance:   Left Eye Distance:   Bilateral Distance:    Right Eye Near:   Left Eye Near:    Bilateral Near:     Physical Exam Vitals and nursing note reviewed.  Constitutional:      General: She is not in acute distress.    Appearance: She is well-developed.  HENT:     Head: Normocephalic and atraumatic.     Mouth/Throat:     Pharynx: No pharyngeal swelling, oropharyngeal exudate, posterior oropharyngeal erythema or uvula swelling.     Comments: Postnasal drip noted Eyes:     Conjunctiva/sclera: Conjunctivae normal.  Cardiovascular:     Rate and Rhythm: Normal rate and regular rhythm.     Heart sounds: No murmur heard. Pulmonary:     Effort: Pulmonary effort is normal. No respiratory distress.     Breath sounds: Normal breath sounds.  Abdominal:     Palpations: Abdomen is soft.     Tenderness: There is no abdominal tenderness.  Musculoskeletal:     Cervical back: Neck supple.  Skin:    General: Skin is warm and dry.  Neurological:     Mental Status: She is alert.     UC Treatments / Results  Labs (all labs ordered are listed, but only abnormal results are displayed) Labs Reviewed  SARS CORONAVIRUS 2 (TAT 6-24 HRS)    EKG   Radiology No results found.  Procedures Procedures (including critical care time)  Medications Ordered in UC Medications - No data to display  Initial Impression / Assessment and Plan / UC Course  I have reviewed the triage vital signs and the nursing notes.  Pertinent labs & imaging results that were available during my care of the patient were reviewed by me and considered in my medical decision making (see chart for details).     URI, COVID test pending.  Discussed supportive treatment, flonase, mucinex, ibuprofen.  Return precautions discussed.  Final Clinical Impressions(s) / UC Diagnoses   Final diagnoses:  Viral upper respiratory tract infection  Encounter for  screening for COVID-19     Discharge Instructions      COVID test pending.  If positive self isolate for  5 days from symptom onset and then wear a mask around others for 5 days.  Recommend supportive treatment at home.  Recommend Flonase and Mucinex, ibuprofen as needed for headache and body aches.  Rest, drink plenty of fluids.  Return for evaluation if you develop worsening symptoms.    ED Prescriptions   None    PDMP not reviewed this encounter.   Ward, Tylene Fantasia, PA-C 10/12/20 1843
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
## 2020-10-12 NOTE — Discharge Instructions (Addendum)
COVID test pending.  If positive self isolate for 5 days from symptom onset and then wear a mask around others for 5 days.  Recommend supportive treatment at home.  Recommend Flonase and Mucinex, ibuprofen as needed for headache and body aches.  Rest, drink plenty of fluids.  Return for evaluation if you develop worsening symptoms.

## 2020-10-12 NOTE — Telephone Encounter (Signed)
Received phone call from patient regarding her STI testing, found to be positive for chlamydia.  Reviewed test results.  Start patient on doxycycline p.o.  This was sent to her pharmacy.

## 2020-10-13 LAB — SARS CORONAVIRUS 2 (TAT 6-24 HRS): SARS Coronavirus 2: NEGATIVE

## 2020-11-15 ENCOUNTER — Encounter (HOSPITAL_COMMUNITY): Payer: Self-pay | Admitting: *Deleted

## 2020-11-15 ENCOUNTER — Other Ambulatory Visit: Payer: Self-pay

## 2020-11-15 ENCOUNTER — Ambulatory Visit (HOSPITAL_COMMUNITY)
Admission: EM | Admit: 2020-11-15 | Discharge: 2020-11-15 | Disposition: A | Discharge status: Home / Self Care | Payer: Medicaid Other | Attending: Emergency Medicine | Admitting: Emergency Medicine

## 2020-11-15 DIAGNOSIS — Z113 Encounter for screening for infections with a predominantly sexual mode of transmission: Secondary | ICD-10-CM

## 2020-11-15 DIAGNOSIS — F17290 Nicotine dependence, other tobacco product, uncomplicated: Secondary | ICD-10-CM | POA: Diagnosis not present

## 2020-11-15 DIAGNOSIS — Z8619 Personal history of other infectious and parasitic diseases: Secondary | ICD-10-CM | POA: Diagnosis not present

## 2020-11-15 DIAGNOSIS — M79601 Pain in right arm: Secondary | ICD-10-CM | POA: Diagnosis not present

## 2020-11-15 DIAGNOSIS — M79602 Pain in left arm: Secondary | ICD-10-CM | POA: Insufficient documentation

## 2020-11-15 LAB — HIV ANTIBODY (ROUTINE TESTING W REFLEX): HIV Screen 4th Generation wRfx: NONREACTIVE

## 2020-11-15 MED ORDER — IBUPROFEN 600 MG PO TABS: 600.0000 mg | ORAL_TABLET | Freq: Three times a day (TID) | ORAL | 0 refills | Status: AC | PRN

## 2020-11-15 MED ORDER — BACLOFEN 10 MG PO TABS: 10.0000 mg | ORAL_TABLET | Freq: Every day | ORAL | 0 refills | Status: AC
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
## 2020-11-16 LAB — CERVICOVAGINAL ANCILLARY ONLY
Bacterial Vaginitis (gardnerella): POSITIVE — AB
Candida Glabrata: NEGATIVE
Candida Vaginitis: NEGATIVE
Chlamydia: NEGATIVE
Comment: NEGATIVE
Comment: NEGATIVE
Comment: NEGATIVE
Comment: NEGATIVE
Comment: NEGATIVE
Comment: NORMAL
Neisseria Gonorrhea: NEGATIVE
Trichomonas: NEGATIVE

## 2020-11-16 LAB — RPR: RPR Ser Ql: NONREACTIVE

## 2020-12-11 IMAGING — CT CT NECK W/ CM
4 series · 16 of 33 positions shown, 19 images · IV contrast (Omni 300)
Comparison: None.

CLINICAL DATA: Nausea, vomiting, chills and fever over the last 5
days. Assess for tonsillitis.

EXAM:
CT NECK WITH CONTRAST
TECHNIQUE: Multidetector CT imaging of the neck was performed using the
standard protocol following the bolus administration of intravenous
contrast.
CONTRAST:  75mL OMNIPAQUE IOHEXOL 300 MG/ML  SOLN

[Series 3: neck 2.0 st · axial · 0.45mm/px · z∈[-197,-155]mm · 2 of 106 slices shown]
[im 22/106  bone]
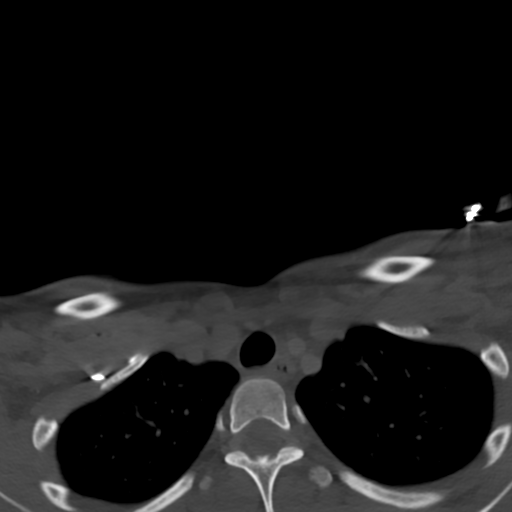
[im 43/106  bone]
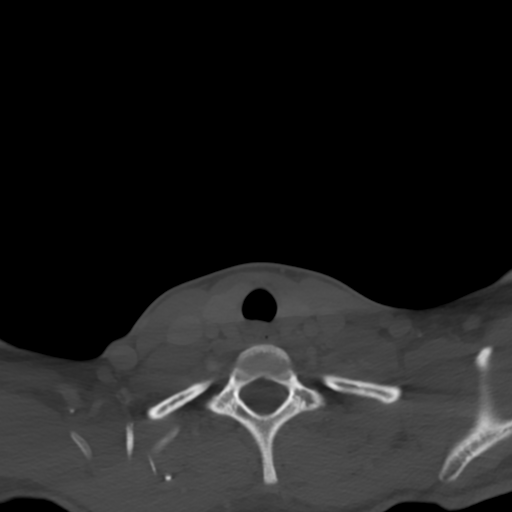

[Series 6: orthogonal · axial · 0.39mm/px · z∈[-256,-80]mm · 6 of 130 slices shown, 8 images]
[im 19/130  soft-tissue]
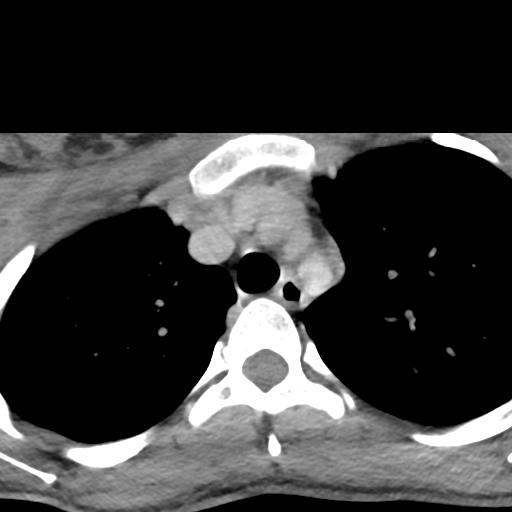
[im 19/130  bone]
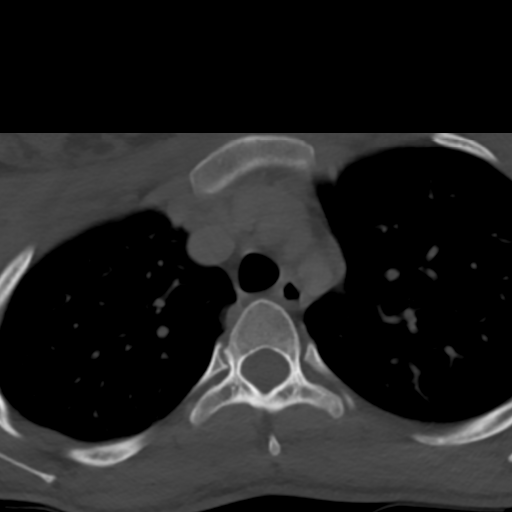
[im 37/130  bone]
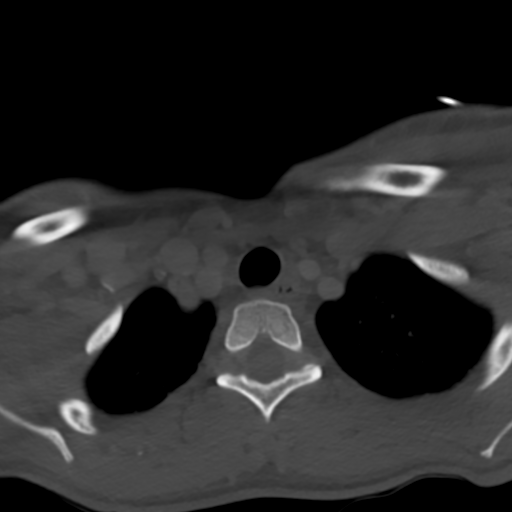
[im 56/130  bone]
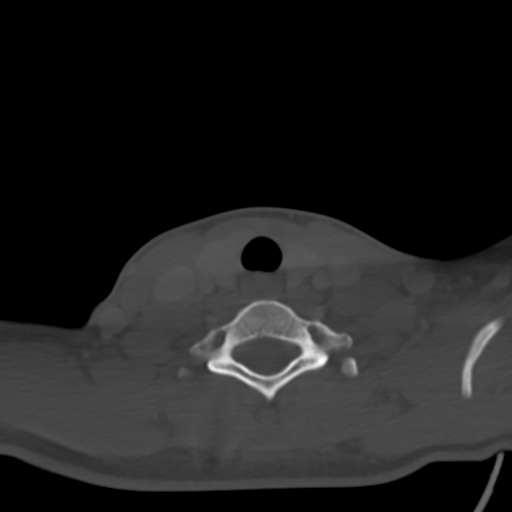
[im 74/130  bone]
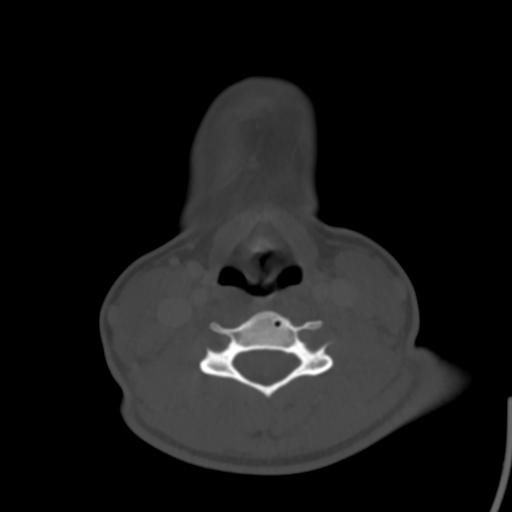
[im 93/130  soft-tissue]
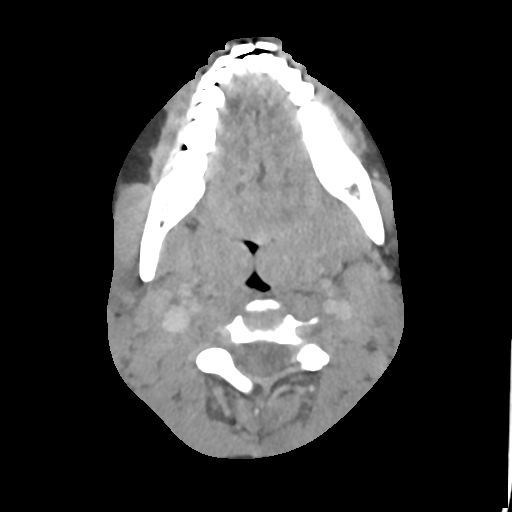
[im 93/130  bone]
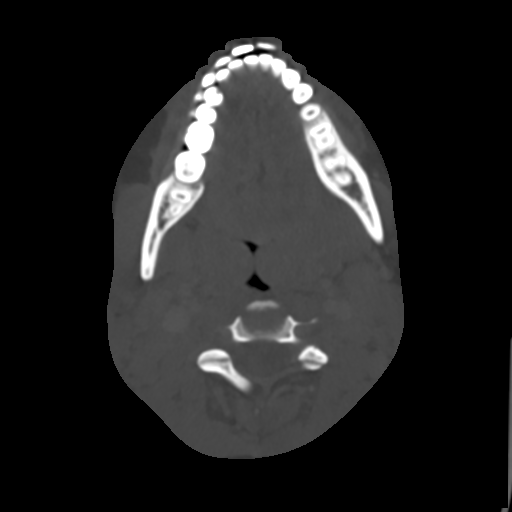
[im 111/130  bone]
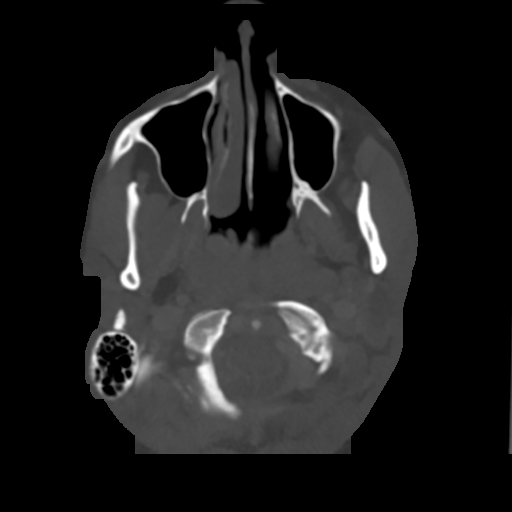

[Series 7: sagittal · sagittal · 0.44mm/px · 5 of 101 slices shown, 6 images]
[im 34/101  bone]
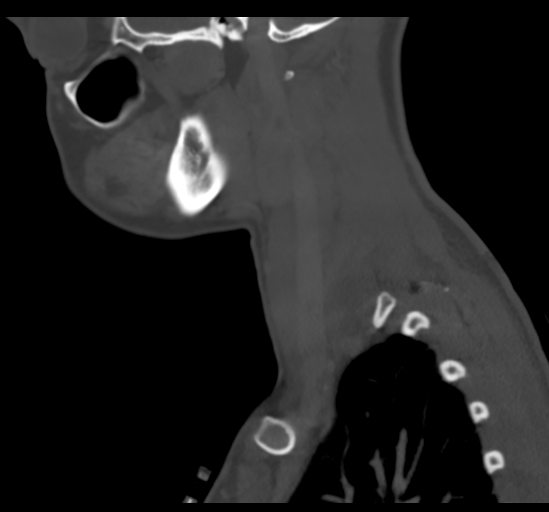
[im 42/101  bone]
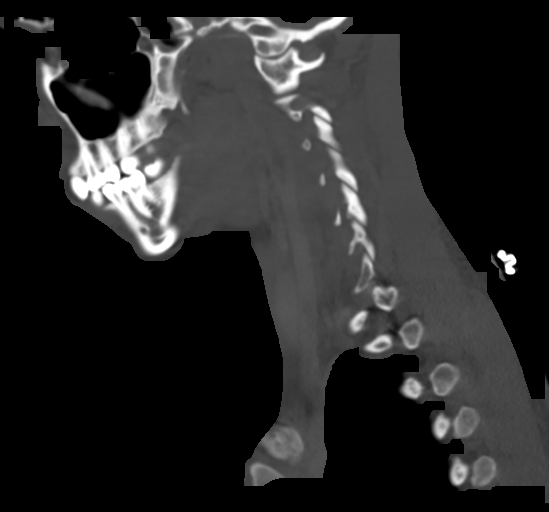
[im 51/101  soft-tissue]
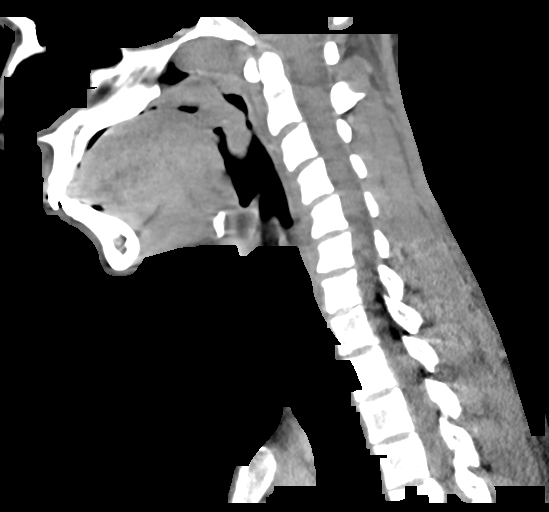
[im 51/101  bone]
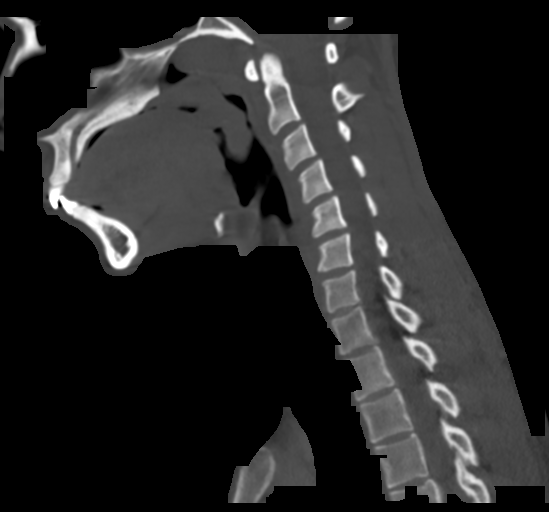
[im 59/101  bone]
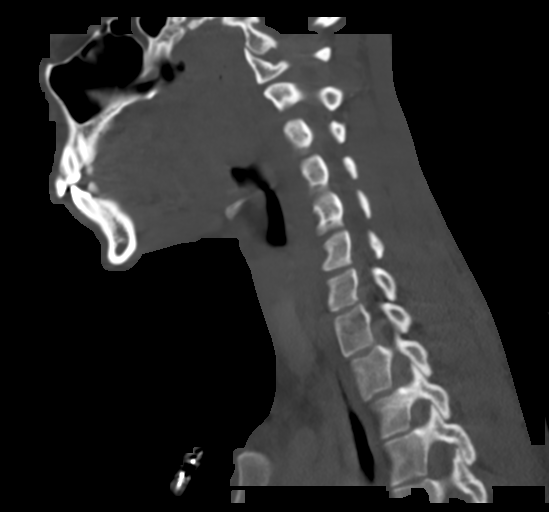
[im 67/101  bone]
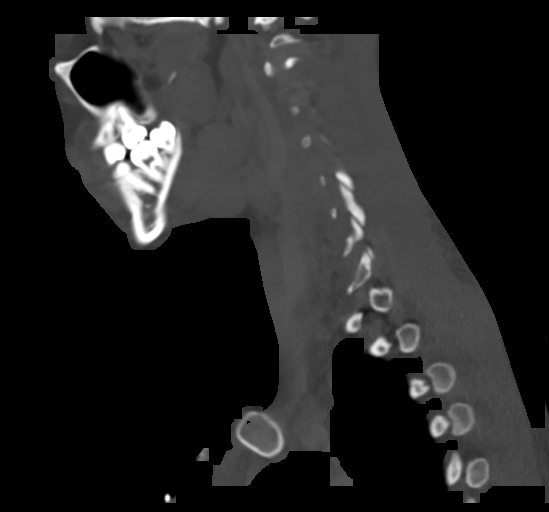

[Series 8: coronal · coronal · 0.44mm/px · 3 of 119 slices shown]
[im 24/119  bone]
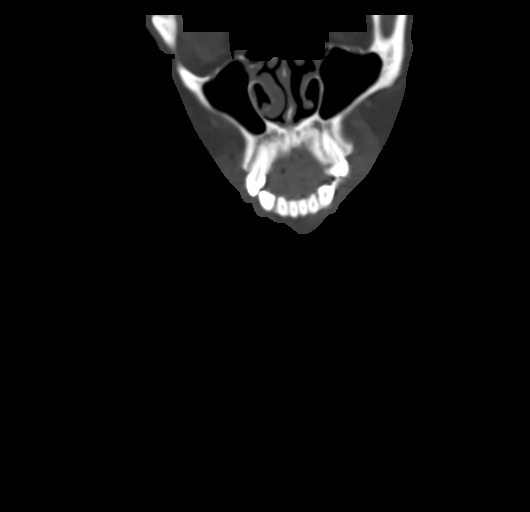
[im 48/119  bone]
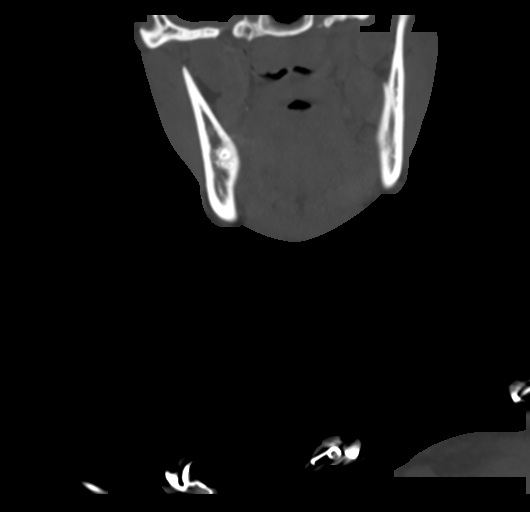
[im 71/119  bone]
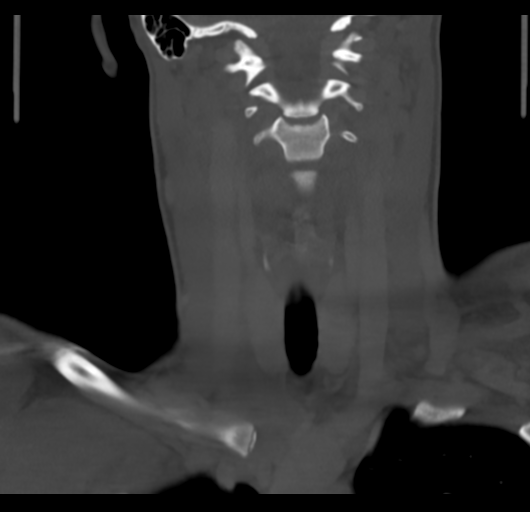

[16 of 33 positions shown; findings below may reference images not displayed]

FINDINGS: Pharynx and larynx: Patient appears to have pharyngeal mucosal
prominence as well as bilateral tonsillar prominence consistent with
pharyngitis and tonsillitis. No evidence tonsillar abscess. The
uvula appears markedly swollen. No parapharyngeal space inflammatory
change.

Salivary glands: Parotid and submandibular glands are normal.

Thyroid: Normal

Lymph nodes: No suppuration. Slightly prominent reactive nodes on
both sides of the neck.

Vascular: Normal

Limited intracranial: Normal

Visualized orbits: Normal

Mastoids and visualized paranasal sinuses: Clear

Skeleton: Normal

Upper chest: Normal

Other: None
IMPRESSION: Consistent with pharyngitis and tonsillitis but without
peritonsillar abscess or parapharyngeal inflammatory change.

## 2022-04-01 IMAGING — CT CT ABD-PELV W/ CM
2 of 4 series · 17 of 46 positions shown, 19 images · IV contrast (APPLIED)
Comparison: None.

CLINICAL DATA: Right lower quadrant abdominal pain since last
night.

EXAM:
CT ABDOMEN AND PELVIS WITH CONTRAST
TECHNIQUE: Multidetector CT imaging of the abdomen and pelvis was performed
using the standard protocol following bolus administration of
intravenous contrast.
CONTRAST:  75mL OMNIPAQUE IOHEXOL 300 MG/ML  SOLN

[Series 2: abd pel w · axial · 0.69mm/px · z∈[+848,+1233]mm · 14 of 85 slices shown, 16 images]
[im 4/85  soft-tissue]
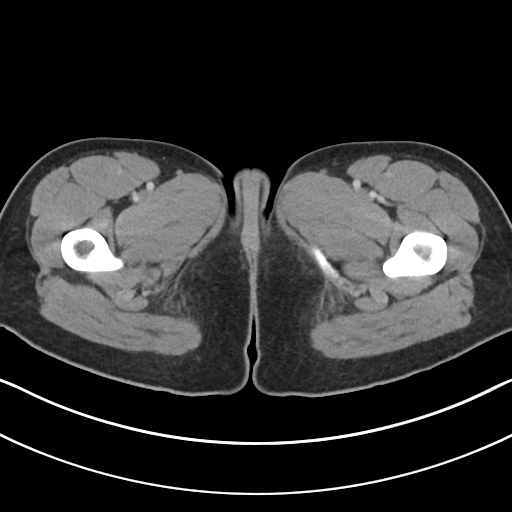
[im 4/85  bone]
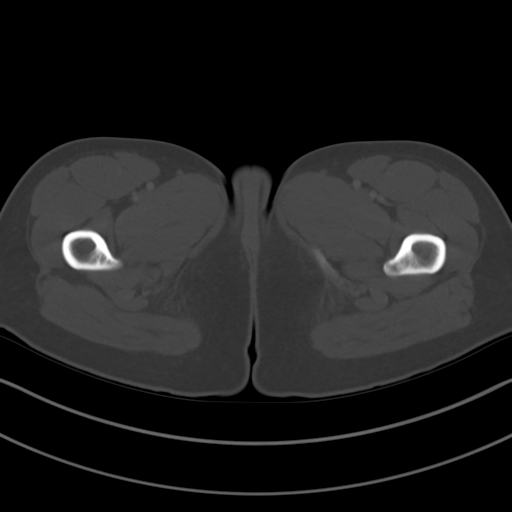
[im 10/85  soft-tissue]
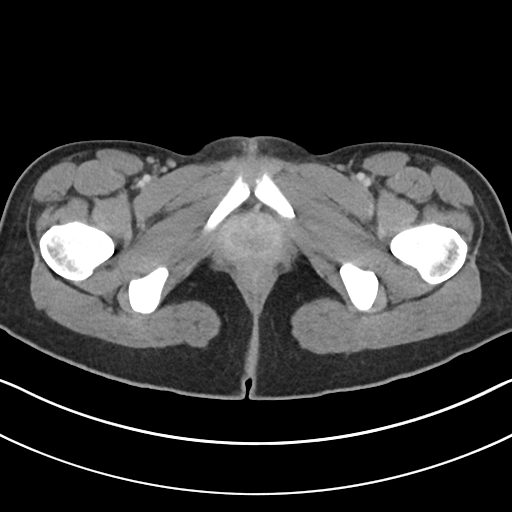
[im 16/85  soft-tissue]
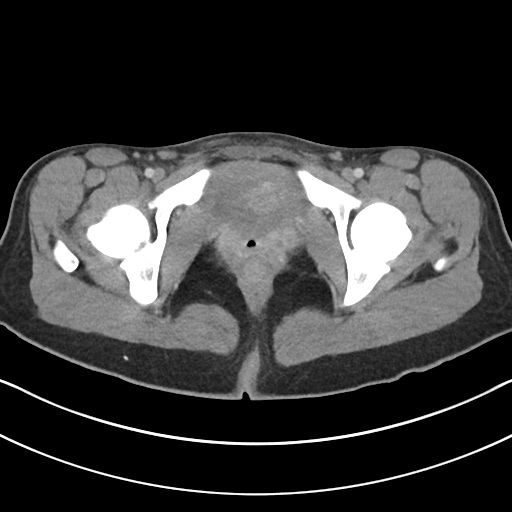
[im 22/85  soft-tissue]
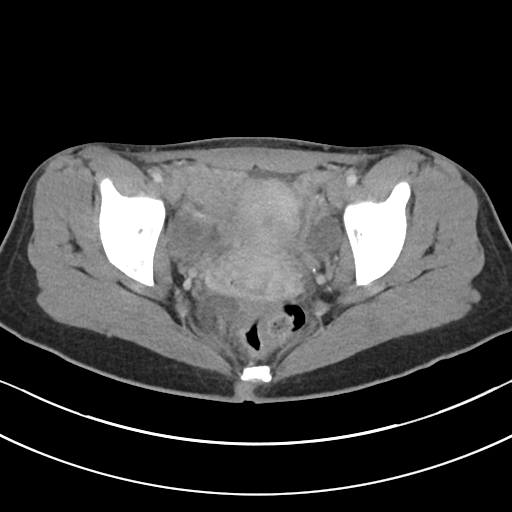
[im 29/85  soft-tissue]
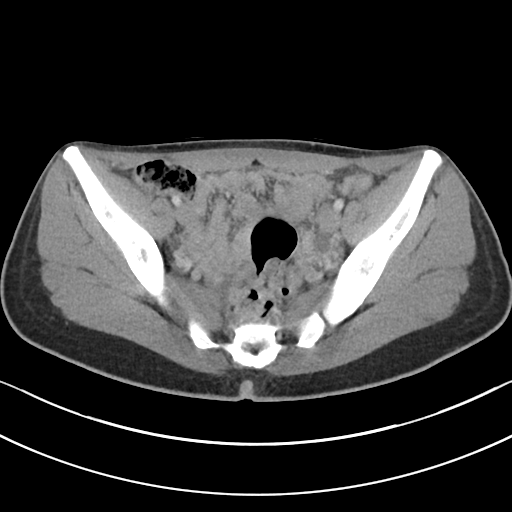
[im 35/85  soft-tissue]
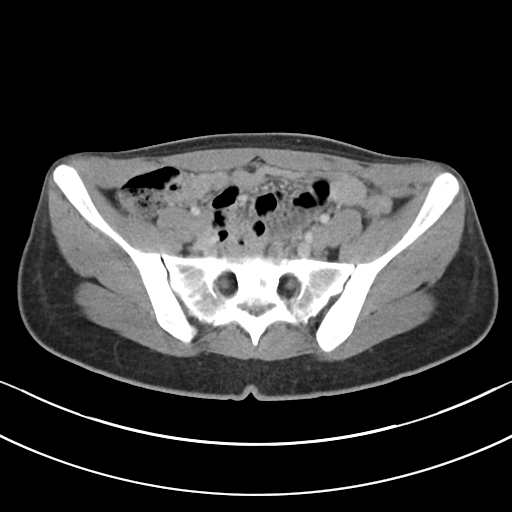
[im 41/85  soft-tissue]
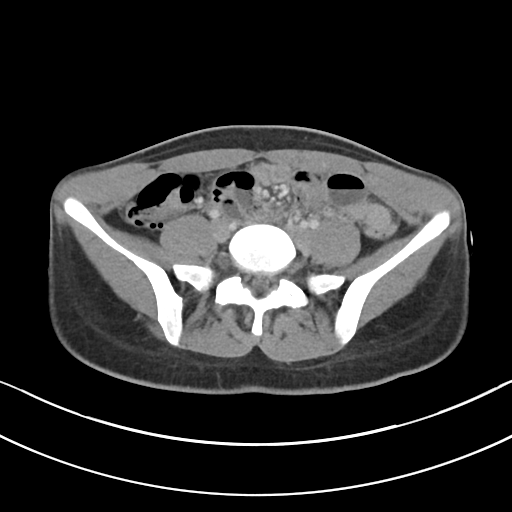
[im 44/85  soft-tissue]
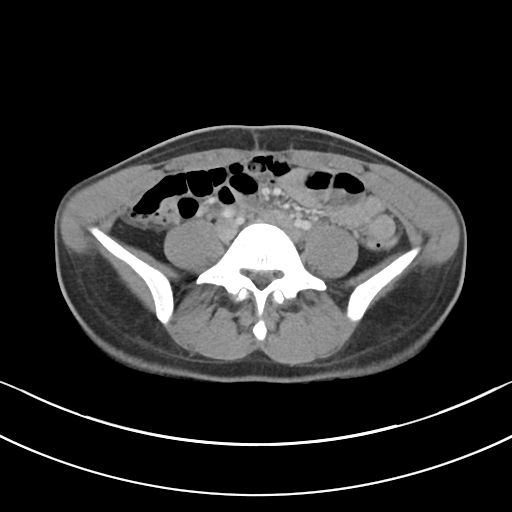
[im 50/85  soft-tissue]
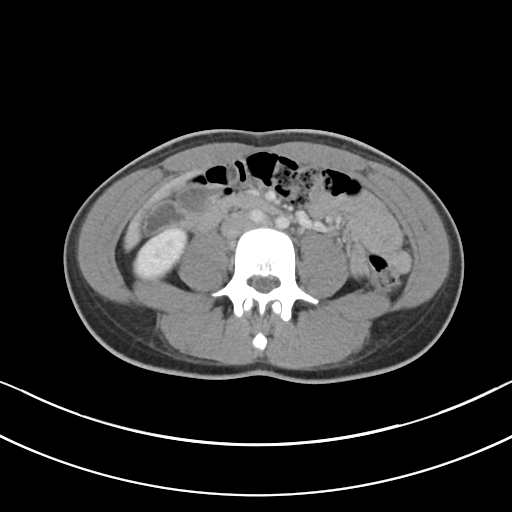
[im 50/85  bone]
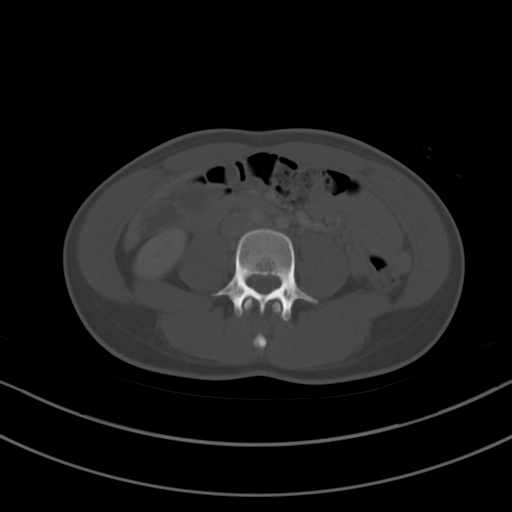
[im 57/85  soft-tissue]
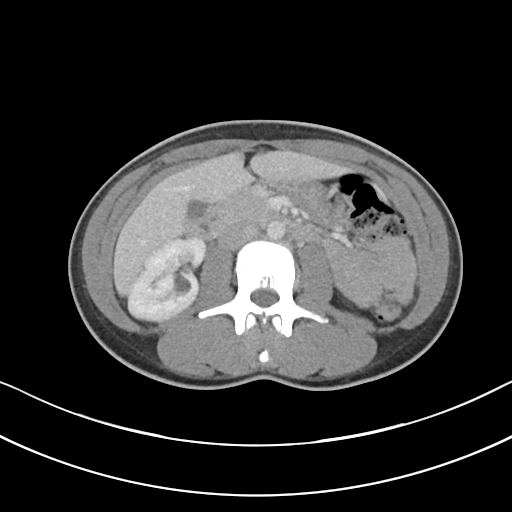
[im 63/85  soft-tissue]
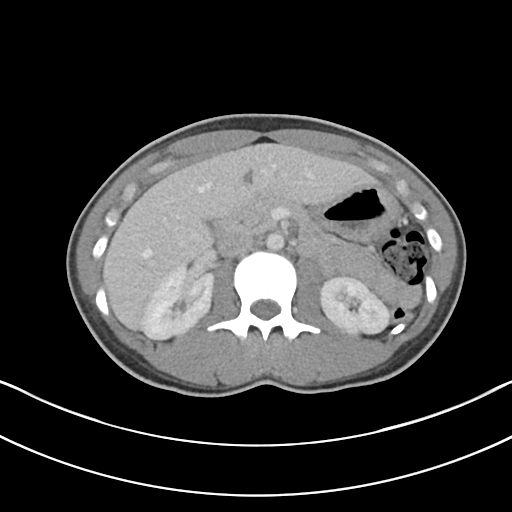
[im 69/85  soft-tissue]
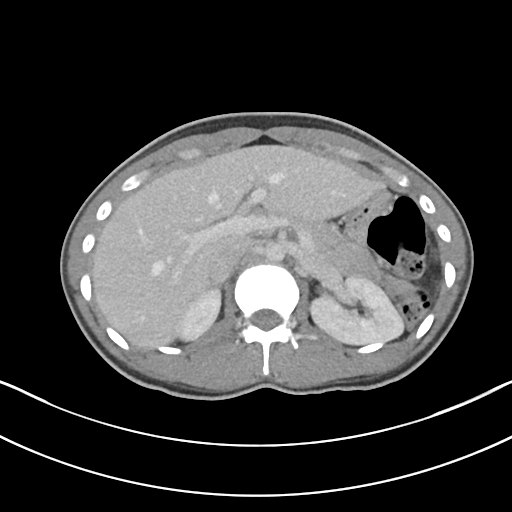
[im 75/85  soft-tissue]
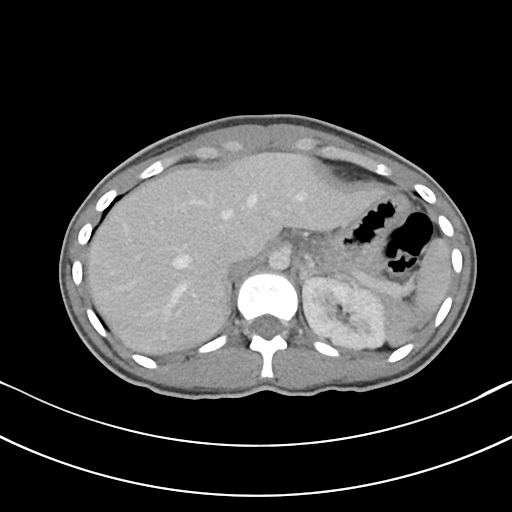
[im 81/85  soft-tissue]
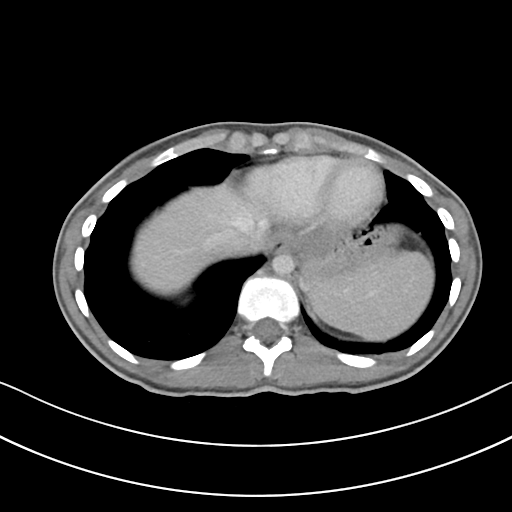

[Series 5: coronal · coronal · 0.76mm/px · 3 of 62 slices shown]
[im 21/62  soft-tissue]
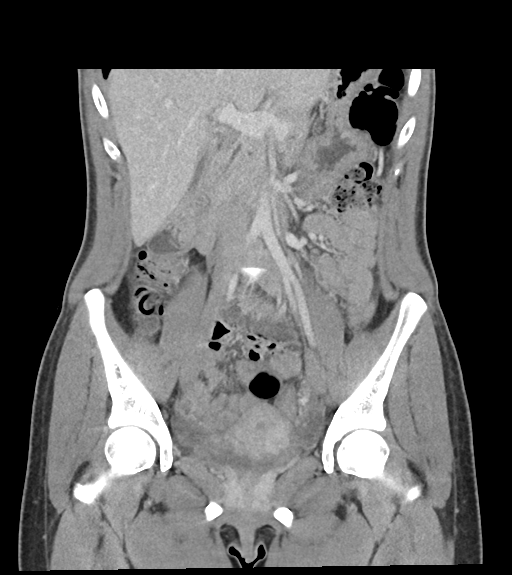
[im 28/62  soft-tissue]
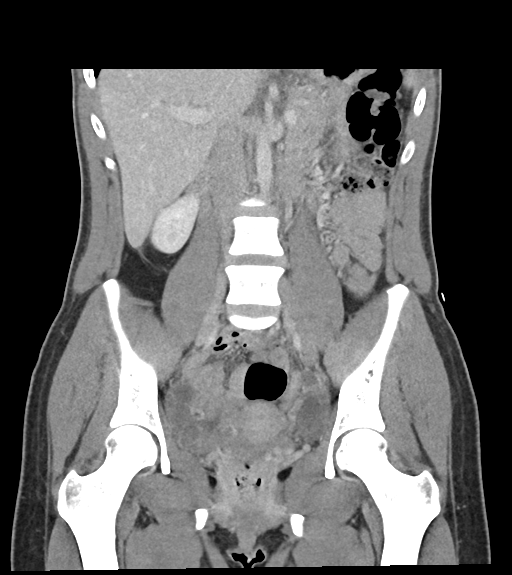
[im 34/62  soft-tissue]
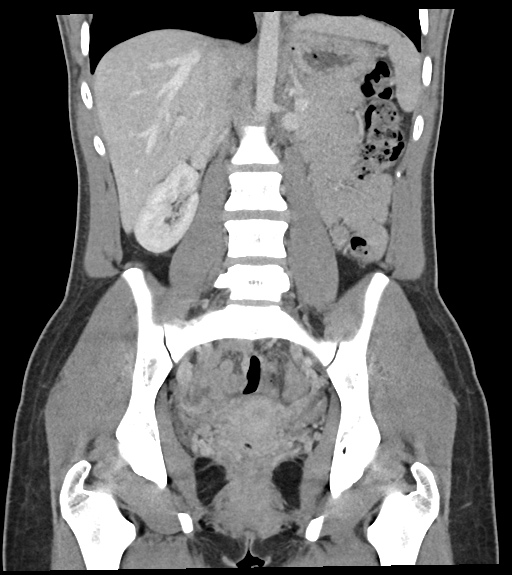

[17 of 46 positions shown; findings below may reference images not displayed]

FINDINGS: Lower chest: No acute abnormality.

Hepatobiliary: No focal liver abnormality is seen. No gallstones,
gallbladder wall thickening, or biliary dilatation.

Pancreas: Unremarkable. No pancreatic ductal dilatation or
surrounding inflammatory changes.

Spleen: Normal in size without focal abnormality.

Adrenals/Urinary Tract: The adrenal glands and left kidney are
unremarkable. Small right renal cyst. No renal calculi or
hydronephrosis. The bladder is largely decompressed.

Stomach/Bowel: Stomach is within normal limits. Appendix appears
normal. No evidence of bowel wall thickening, distention, or
inflammatory changes.

Vascular/Lymphatic: No significant vascular findings are present. No
enlarged abdominal or pelvic lymph nodes.

Reproductive: Uterus and bilateral adnexa are unremarkable.

Other: Small amount of low to intermediate density fluid in the
pelvis. No pneumoperitoneum.

Musculoskeletal: No acute or significant osseous findings.
IMPRESSION: 1. No acute intra-abdominal process. Normal appendix.
2. Small amount of low to intermediate density fluid in the pelvis,
which could reflect a ruptured ovarian cyst.

## 2022-05-19 IMAGING — US US PELVIS COMPLETE TRANSABD/TRANSVAG W DUPLEX
1 series · 13 of 25 positions shown · non-contrast
Comparison: None

CLINICAL DATA: Pelvic pain, LEFT lower quadrant pain for 2 days,
LMP 08/17/2019

EXAM:
TRANSABDOMINAL AND TRANSVAGINAL ULTRASOUND OF PELVIS
DOPPLER ULTRASOUND OF OVARIES
TECHNIQUE: Both transabdominal and transvaginal ultrasound examinations of the
pelvis were performed. Transabdominal technique was performed for
global imaging of the pelvis including uterus, ovaries, adnexal
regions, and pelvic cul-de-sac.
It was necessary to proceed with endovaginal exam following the
transabdominal exam to visualize the lower uterine segment,
endometrium, and adnexa. Color and duplex Doppler ultrasound was
utilized to evaluate blood flow to the ovaries.

[Series 1: us pelvis (transabdominal only) · 13 of 66 slices shown]
[im 1/66]
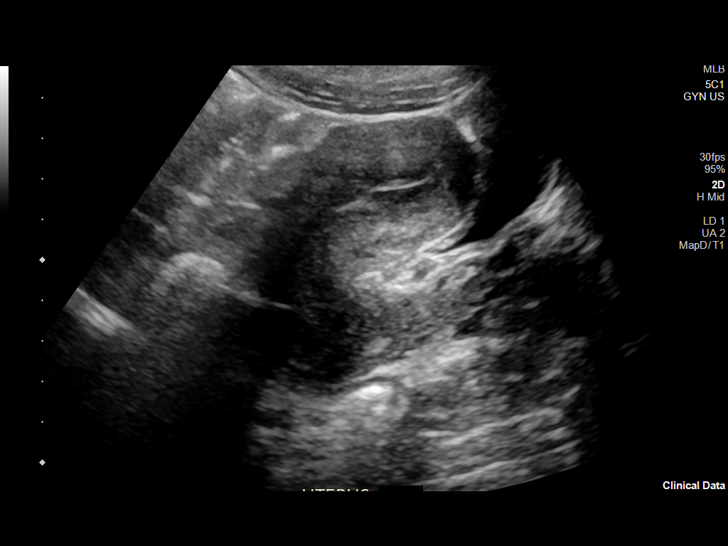
[im 6/66]
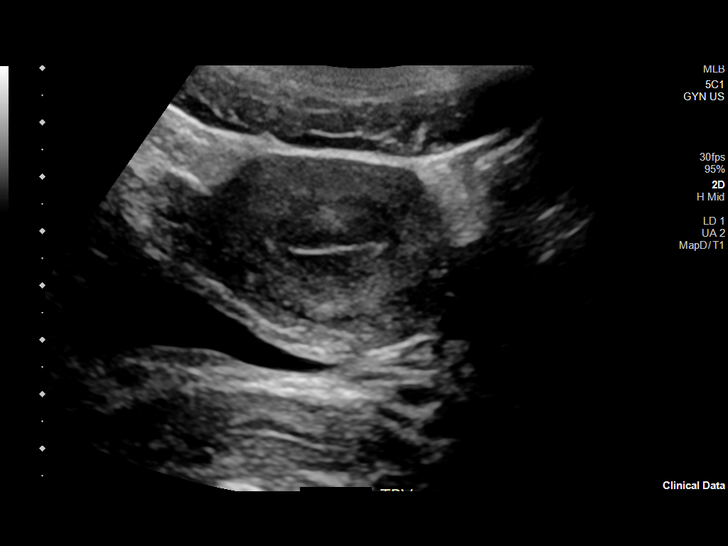
[im 11/66]
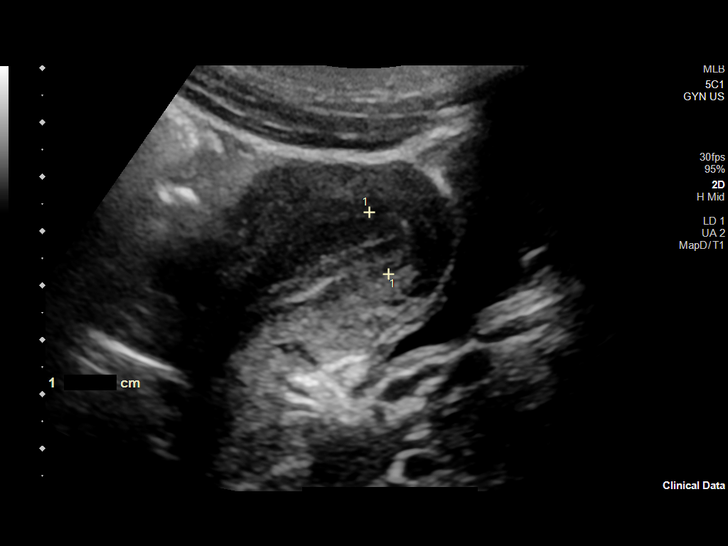
[im 17/66]
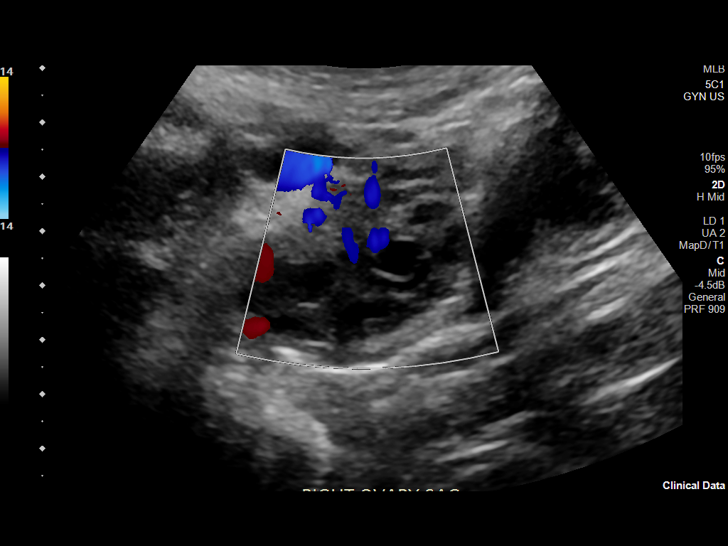
[im 22/66]
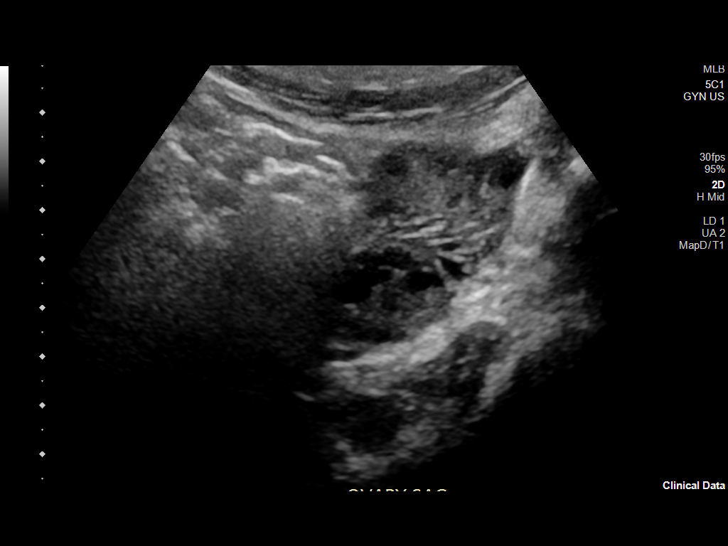
[im 28/66]
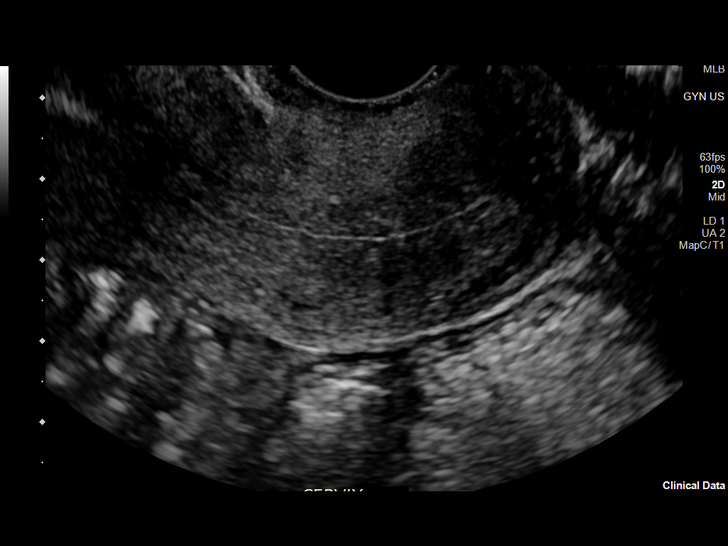
[im 33/66]
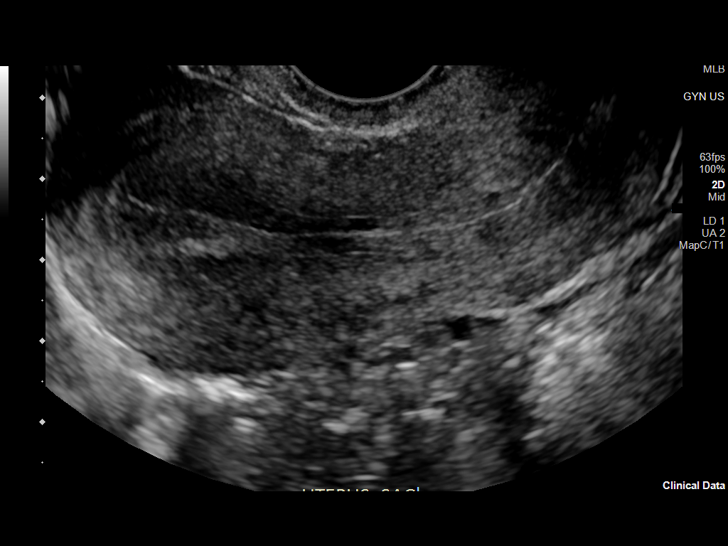
[im 38/66]
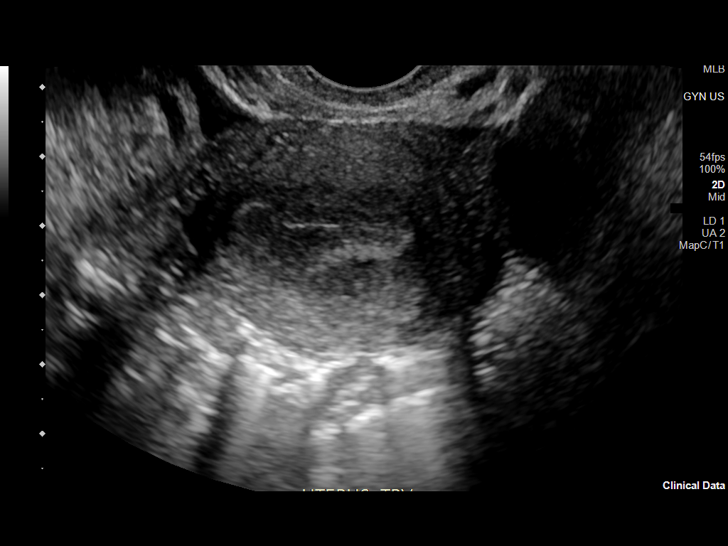
[im 44/66]
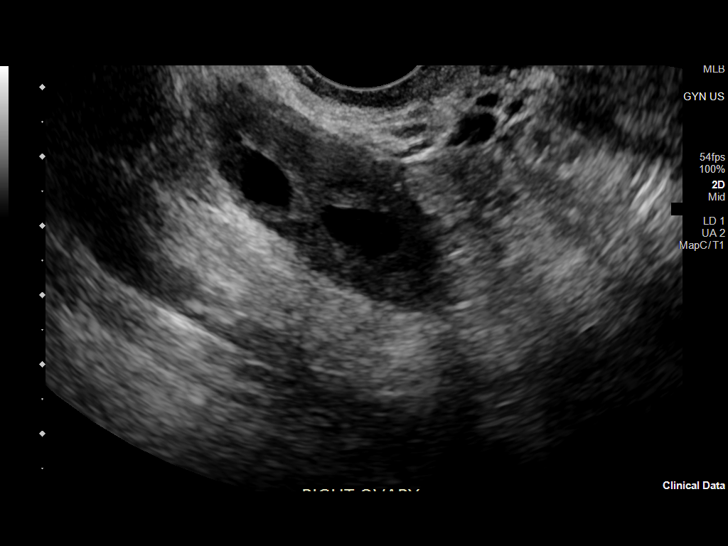
[im 49/66]
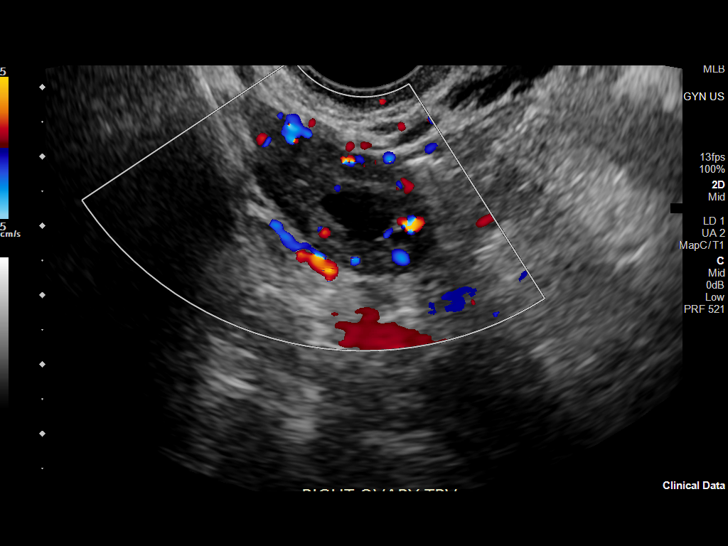
[im 55/66]
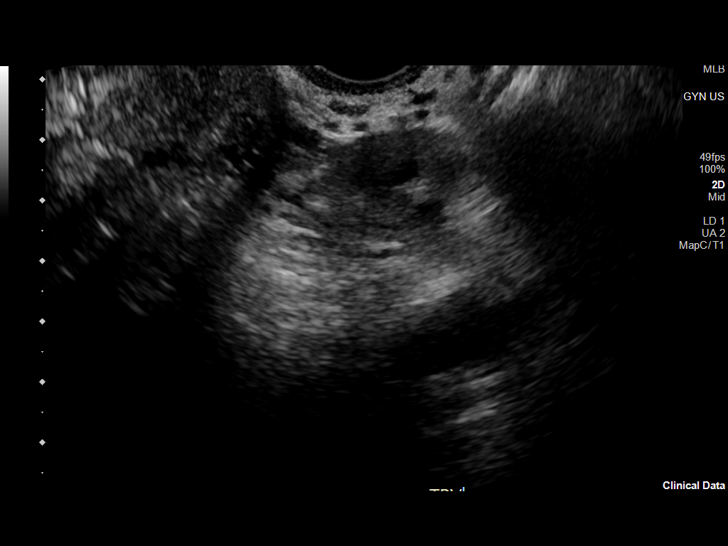
[im 60/66]
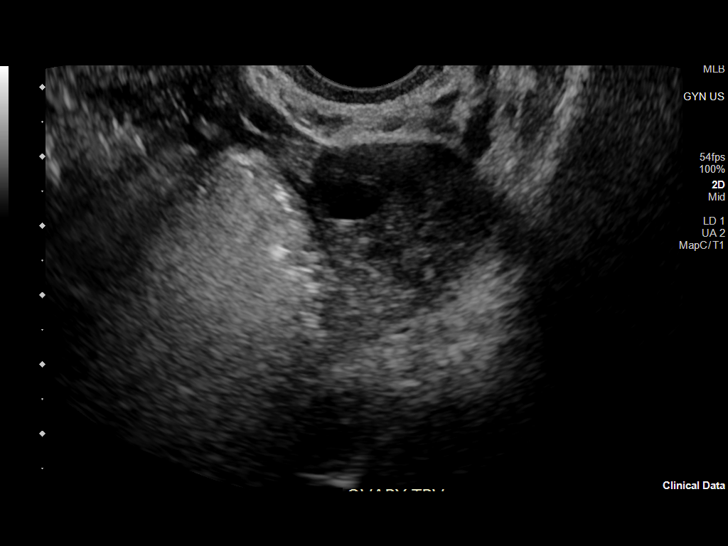
[im 66/66]
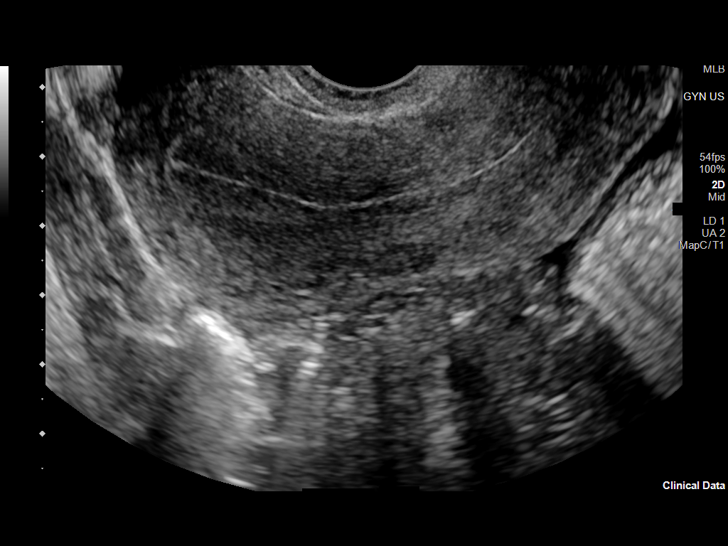

[13 of 25 positions shown; findings below may reference images not displayed]

FINDINGS: Uterus

Measurements: 8.2 x 3.5 x 4.8 cm = volume: 73 mL. Anteverted. Normal
morphology without mass

Endometrium

Thickness: 13 mm.  No endometrial fluid or focal abnormality

Right ovary

Measurements: 4.6 x 1.8 x 1.9 cm = volume: 8.2 mL. Normal morphology
without mass. Internal blood flow present on color Doppler imaging.

Left ovary

Measurements: 4.8 x 2.3 x 3.0 cm = volume: 17.3 mL. Normal
morphology without mass. Internal blood flow present on color
Doppler imaging.

Pulsed Doppler evaluation of both ovaries demonstrates normal
low-resistance arterial and venous waveforms.

Other findings

Trace nonspecific free pelvic fluid.

No adnexal masses.
IMPRESSION: Normal exam.
